# Patient Record
Sex: Male | Born: 1965 | Hispanic: Yes | Marital: Single | Smoking: Never smoker
Health system: Southern US, Community
[De-identification: ages and names within clinical notes are randomized; demographics above are authoritative.]

## PROBLEM LIST (undated history)

## (undated) DIAGNOSIS — I10 Essential (primary) hypertension: Secondary | ICD-10-CM

## (undated) DIAGNOSIS — E7800 Pure hypercholesterolemia, unspecified: Secondary | ICD-10-CM

## (undated) DIAGNOSIS — I2510 Atherosclerotic heart disease of native coronary artery without angina pectoris: Secondary | ICD-10-CM

## (undated) DIAGNOSIS — E785 Hyperlipidemia, unspecified: Secondary | ICD-10-CM

## (undated) DIAGNOSIS — Z9289 Personal history of other medical treatment: Secondary | ICD-10-CM

## (undated) HISTORY — DX: Essential (primary) hypertension: I10

## (undated) HISTORY — DX: Hyperlipidemia, unspecified: E78.5

## (undated) HISTORY — DX: Personal history of other medical treatment: Z92.89

---

## 2021-04-22 ENCOUNTER — Ambulatory Visit
Admission: EM | Admit: 2021-04-22 | Discharge: 2021-04-22 | Payer: 59 | Attending: Emergency Medicine | Admitting: Emergency Medicine

## 2021-04-22 ENCOUNTER — Emergency Department
Admission: EM | Admit: 2021-04-22 | Discharge: 2021-04-22 | Disposition: A | Discharge status: Home / Self Care | Payer: 59 | Attending: Emergency Medicine | Admitting: Emergency Medicine

## 2021-04-22 ENCOUNTER — Emergency Department: Payer: 59

## 2021-04-22 ENCOUNTER — Other Ambulatory Visit: Payer: Self-pay

## 2021-04-22 ENCOUNTER — Encounter: Payer: Self-pay | Admitting: Emergency Medicine

## 2021-04-22 DIAGNOSIS — R079 Chest pain, unspecified: Secondary | ICD-10-CM | POA: Diagnosis not present

## 2021-04-22 DIAGNOSIS — F419 Anxiety disorder, unspecified: Secondary | ICD-10-CM | POA: Diagnosis not present

## 2021-04-22 DIAGNOSIS — R0789 Other chest pain: Secondary | ICD-10-CM | POA: Insufficient documentation

## 2021-04-22 HISTORY — DX: Essential (primary) hypertension: I10

## 2021-04-22 HISTORY — DX: Pure hypercholesterolemia, unspecified: E78.00

## 2021-04-22 LAB — BASIC METABOLIC PANEL
Anion gap: 10 (ref 5–15)
BUN: 16 mg/dL (ref 6–20)
CO2: 26 mmol/L (ref 22–32)
Calcium: 9.5 mg/dL (ref 8.9–10.3)
Chloride: 99 mmol/L (ref 98–111)
Creatinine, Ser: 1.01 mg/dL (ref 0.61–1.24)
GFR, Estimated: >60 mL/min (ref >60)
Glucose, Bld: 117 mg/dL — ABNORMAL HIGH (ref 70–99)
Potassium: 4.1 mmol/L (ref 3.5–5.1)
Sodium: 135 mmol/L (ref 135–145)

## 2021-04-22 LAB — TROPONIN I (HIGH SENSITIVITY)
Troponin I (High Sensitivity): 3 ng/L (ref <18)
Troponin I (High Sensitivity): 3 ng/L (ref <18)

## 2021-04-22 LAB — CBC
HCT: 44.3 % (ref 39.0–52.0)
Hemoglobin: 14.7 g/dL (ref 13.0–17.0)
MCH: 30.1 pg (ref 26.0–34.0)
MCHC: 33.2 g/dL (ref 30.0–36.0)
MCV: 90.6 fL (ref 80.0–100.0)
Platelets: 360 10*3/uL (ref 150–400)
RBC: 4.89 MIL/uL (ref 4.22–5.81)
RDW: 13.7 % (ref 11.5–15.5)
WBC: 10 10*3/uL (ref 4.0–10.5)
nRBC: 0 % (ref 0.0–0.2)

## 2021-04-22 NOTE — ED Triage Notes (Signed)
Pt here with cp that has gotten worse lately. Pt had a scan done that showed that he had an issue with his arteries. Pt states that his chest has been burning. Pt has been unable to establish a cardiologist due to insurance issues. Pt states pain is left sided and radiates to his arm. Pt in NAD in triage.

## 2021-04-22 NOTE — ED Provider Notes (Signed)
[facility] Provider Note    Event Date/Time   First MD Initiated Contact with Patient 04/22/21 1347     (approximate)   History   Chest Pain   HPI  Nathan Hammond is a 56 y.o. male   presents to the ED with complaint of left-sided chest pain for 1 month.  He was seen at [facility] urgent care this morning and was told to come to the emergency department.  Patient denies any diaphoresis, indigestion, shortness of breath, difficulty breathing, reflux symptoms, dizziness or headache.  Patient states he is very anxious as he got a phone call from [employer] letting him know that his arteries were clogging up.  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  This was done on 04/07/2021 a states that recently he got a phone call stating that he he should be seen in the emergency department immediately.  Patient did not go on that day but became very anxious about the information that he was given after talking to his family members.  Patient has history of hypertension but lost approximately 80 pounds and discontinued the blood pressure medication per his PCP in [place] at the time.  Information that he has from the [employer] company states that his overall calcium score is 775.  Patient has lab work that shows a total cholesterol 282, LDL cholesterol 202, cholesterol/HDL ratio 5.5 and a glucose nonfasting of 100.  Patient does state that he is very nervous since receiving this information.  When asked if he has been restricted from his regular activities due to his chest pain he states that he worked a full shift last weekend without any difficulty and that he had no difficulty walking from the parking lot.  He denies any shortness of breath with exertion and this does not increase the pain in his chest.  He describes it as a nonradiating type pain.  Currently rates his pain as 7 out of  10.    Physical Exam   Triage Vital Signs: ED Triage Vitals  Enc Vitals Group     BP 04/22/21 1112 (!) 120/102     Pulse Rate 04/22/21 1112 70     Resp 04/22/21 1112 18     Temp 04/22/21 1112 98.6 F (37 C)     Temp Source 04/22/21 1112 Oral     SpO2 04/22/21 1112 100 %     Weight 04/22/21 1113 190 lb (86.2 kg)     Height 04/22/21 1113 5\' 8"  (1.727 m)     Head Circumference --      Peak Flow --      Pain Score 04/22/21 1112 7     Pain Loc --      Pain Edu? --      Excl. in GC? --     Most recent vital signs: Vitals:   04/22/21 1112 04/22/21 1312  BP: (!) 120/102 115/73  Pulse: 70 61  Resp: 18 16  Temp: 98.6 F (37 C) 98.3 F (36.8 C)  SpO2: 100% 100%     General: Awake, no distress.  Anxious and talkative. CV:  Good peripheral perfusion.  Regular rate and rhythm without murmur. Resp:  Normal effort.  Lungs are clear bilaterally. Abd:  No distention.  Soft nontender bowel sounds normoactive. Other:     ED Results / Procedures / Treatments   Labs (  all labs ordered are listed, but only abnormal results are displayed) Labs Reviewed  BASIC METABOLIC PANEL - Abnormal; Notable for the following components:      Result Value   Glucose, Bld 117 (*)    All other components within normal limits  CBC  TROPONIN I (HIGH SENSITIVITY)  TROPONIN I (HIGH SENSITIVITY)     EKG Normal sinus rhythm. Vent. rate 68 BPM PR interval 176 ms QRS duration 86 ms QT/QTcB 400/425 ms P-R-T axes 41 84 1   RADIOLOGY Chest x-ray images were reviewed and no acute findings noted by this provider.  Radiology report was read and no cardiopulmonary disease noted.    PROCEDURES:  Critical Care performed:   Procedures   MEDICATIONS ORDERED IN ED: Medications - No data to display   IMPRESSION / MDM / ASSESSMENT AND PLAN / ED COURSE  I reviewed the triage vital signs and the nursing notes.   Differential diagnosis includes, but is not limited to, atypical chest pain,  chest wall pain, anxiety, muscle skeletal pain.  Extensive HPI as noted above.  56 year old male presents to the ED with 1 month history of left-sided chest wall pain.  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  Patient had a history of hypertension but after losing approximately 80 pounds he was told by his PCP in [place] that he no longer needed lisinopril he been taking for hypertension.  EKG and chest x-ray were reviewed.  CBC, BMP and 2 separate troponins with a level of 3 each time was reassuring.  Patient was told that he still would need to follow-up with cardiologist.  He was given contact information for Dr. Okey Dupre who is on-call for cardiology day was given to him with address and phone number.  Patient was reassured that if there was heart damage that his troponin should be elevated which helped with his anxiety.  He was also told to return to the emergency department should his chest wall pain change, become severe, with any shortness of breath or difficulty breathing, indigestion or diaphoresis.  Lab work, EKG and troponin were discussed with Dr. Delton Prairie who was my attending today.   FINAL CLINICAL IMPRESSION(S) / ED DIAGNOSES   Final diagnoses:  Anterior chest wall pain  Anxiety     Rx / DC Orders   ED Discharge Orders     None        Note:  This document was prepared using Dragon voice recognition software and may include unintentional dictation errors.   Tommi Rumps, PA-C 04/22/21 1557    Delton Prairie, MD 04/25/21 [phone]

## 2021-04-22 NOTE — Discharge Instructions (Addendum)
As we discussed your active chest pain coupled with your skin are concerning for a coronary event even though your EKG is unremarkable.  Please go to the ER at [facility] via EMS.  Please go now.

## 2021-04-22 NOTE — ED Notes (Signed)
First Nurse Note:  Pt to ED via [facility] from urgent care for chest pain. Pt has been having chest pain x 1 month. Pt had a scan done at the RV show and was told he had blockage in his heart and that he needed to come to the ED. Pt has been having near syncopal episodes since getting that phone call. Pt is in NAD.

## 2021-04-22 NOTE — ED Provider Notes (Addendum)
[facility]    CSN: [id number] Arrival date & time: 04/22/21  4098      History   Chief Complaint Chief Complaint  Patient presents with   Chest Pain    HPI Nathan Hammond is a 56 y.o. male.   HPI  56 year old male here for evaluation of chest pain.  Patient reports that he has been having chest pain that waxes and wanes but is always present since the beginning of the year.  On 04/07/2021 he had a CT at [facility] which showed concerning overall calcium scoring of 775 with a score of 666 to his left anterior descending.  He reports that representative from [employer] [facility] called him last week and suggested he go to the emergency department for evaluation because he was having pain.  He did not go.  He states he is currently having pain in his left chest that radiates down his left arm and is causing some numbness in his left arm.  He describes the pain as a burning and rates it 5-7/10.  He did have an episode of syncope last week for an unknown duration but has not had any further episodes of syncope.  He is not currently reporting radiation to his jaw, nausea, shortness of breath, or sweating.  Patient denies smoking.  We have no past medical history on file and patient denies any significant past medical history.  He is obese.  Upon further questioning patient admits that he was on lisinopril in the past for high blood pressure but he has not taken it recently as his blood pressure improved with weight loss.  Past Medical History:  Diagnosis Date   High blood pressure    High cholesterol     There are no problems to display for this patient.   History reviewed. No pertinent surgical history.      Home Medications    Prior to Admission medications   Medication Sig Start Date End Date Taking? Authorizing Provider  LISINOPRIL PO Take by mouth.   Yes [provider]    Family History History reviewed. No pertinent family history.  Social  History Social History   Tobacco Use   Smoking status: Never   Smokeless tobacco: Never  Vaping Use   Vaping Use: Never used  Substance Use Topics   Alcohol use: Yes   Drug use: Never     Allergies   Patient has no known allergies.   Review of Systems Review of Systems  Respiratory:  Negative for shortness of breath and wheezing.   Cardiovascular:  Positive for chest pain. Negative for palpitations and leg swelling.  Gastrointestinal:  Negative for nausea.  Neurological:  Positive for numbness. Negative for dizziness.    Physical Exam Triage Vital Signs ED Triage Vitals  Enc Vitals Group     BP      Pulse      Resp      Temp      Temp src      SpO2      Weight      Height      Head Circumference      Peak Flow      Pain Score      Pain Loc      Pain Edu?      Excl. in GC?    No data found.  Updated Vital Signs BP (!) 152/68 (BP Location: Left Arm)    Pulse 89    Temp  98.3 F (36.8 C) (Oral)    Resp 18    SpO2 100%   Visual Acuity Right Eye Distance:   Left Eye Distance:   Bilateral Distance:    Right Eye Near:   Left Eye Near:    Bilateral Near:     Physical Exam Vitals and nursing note reviewed.  Constitutional:      General: He is in acute distress.     Appearance: He is obese.  HENT:     Head: Normocephalic and atraumatic.  Neck:     Vascular: No carotid bruit.  Cardiovascular:     Rate and Rhythm: Normal rate and regular rhythm.     Pulses: Normal pulses.     Heart sounds: Normal heart sounds. No murmur heard.   No friction rub. No gallop.  Pulmonary:     Effort: Pulmonary effort is normal.     Breath sounds: Normal breath sounds. No wheezing, rhonchi or rales.  Musculoskeletal:     Cervical back: Normal range of motion and neck supple.  Skin:    General: Skin is warm and dry.     Capillary Refill: Capillary refill takes less than 2 seconds.     Findings: No erythema or rash.  Neurological:     General: No focal deficit present.      Mental Status: He is alert and oriented to person, place, and time.  Psychiatric:        Mood and Affect: Mood normal.        Behavior: Behavior normal.        Thought Content: Thought content normal.        Judgment: Judgment normal.     UC Treatments / Results  Labs (all labs ordered are listed, but only abnormal results are displayed) Labs Reviewed - No data to display  EKG Normal sinus rhythm with a ventricular rate of 85 bpm PR interval 160 ms QRS duration 90 ms QT/QTc 388/461 ms No T wave or ST abnormalities noted No other tracings available for comparison in epic.   Radiology No results found.  Procedures Procedures (including critical care time)  Medications Ordered in UC Medications - No data to display  Initial Impression / Assessment and Plan / UC Course  I have reviewed the triage vital signs and the nursing notes.  Pertinent labs & imaging results that were available during my care of the patient were reviewed by me and considered in my medical decision making (see chart for details).  As described in HPI above this is a 56 year old male here for evaluation of left-sided chest pain that has been ongoing since the first of the year and has intensified over the last several days.  This was associated with 1 episode of syncope last week but no further episodes.  No sweating, nausea, or radiation to the jaw.  No dizziness.  Patient is currently having left-sided chest pain with radiation to the left arm that he rates 5-7/10.  EKG obtained shows normal sinus rhythm with no ST or T wave abnormalities noted.  Patient did have a CT performed at [facility] on 04/07/2021 which showed significant calcium scoring to the left anterior descending.  He was advised by that company to go to the emergency department but he did not.  He is here today because he is having significant pain with radiation to his left arm.  He did start taking aspirin after having his scan and he  took 325 mg of aspirin last  night.  Patient's physical exam reveals a patient who is in a moderate degree of distress with left-sided chest pain.  His heart sounds are S1-S2 and regular rate and rhythm.  No murmur, rub, or ectopy.  Lung sounds are clear to auscultation all fields.  No bruits appreciated when auscultating carotid arteries bilaterally.  Given his active chest pain and a calcium scoring of his recent scan I am concerned that patient is having a coronary event and I have recommended that he go to the emergency department for evaluation.  911 has been called and is in route.  Report given to [facility].  Care transferred.   Final Clinical Impressions(s) / UC Diagnoses   Final diagnoses:  Chest pain, unspecified type     Discharge Instructions      As we discussed your active chest pain coupled with your skin are concerning for a coronary event even though your EKG is unremarkable.  Please go to the ER at [facility] via EMS.  Please go now.     ED Prescriptions   None    PDMP not reviewed this encounter.   Becky Augusta, NP 04/22/21 1000    Becky Augusta, NP 04/22/21 [phone]

## 2021-04-22 NOTE — Discharge Instructions (Signed)
Call make an appointment with Dr. Okey Dupre who is the cardiologist on-call today.  When you call to make the appointment and let them know that you were seen in the emergency department so that they may look over your lab work.  Also keep your appointment with the primary care provider at [facility].  Continue with your regular routine.  If you develop any worsening of your chest pain, shortness of breath, difficulty breathing, indigestion, sweating profusely return to the emergency department immediately.

## 2021-04-22 NOTE — ED Notes (Signed)
Provider at bedside

## 2021-04-22 NOTE — ED Notes (Addendum)
See first nurse and triage note. Pt has been having burning chest pain on L chest since 1 month. Had "chest scan" 04/07/21 in [place] which revealed significant coronary blockages.  Has been having intermittent tingling/pins and needles feeling in L hand since 1 month.  Denies SOB. Is very worried about burning chest pain that has not resolved and results of scan which showed blockages in coronary arteries.   Also concerned because has been having near syncopal episodes with feeling dizzy. Denies exertional dyspnea.  Provider at bedside.

## 2021-04-22 NOTE — ED Triage Notes (Signed)
Patient presents to Urgent Care with complaints of chest pain. He states chest pain started since the beginning of this year. He had scans completed in [place]. Reports from the reports they instructed pt to go to the ED since last week. He also reports some numbness/tingling sensation to left arm.   Denies SOB.

## 2021-04-22 NOTE — ED Notes (Signed)
Patient is being discharged from the Urgent Care and sent to the Emergency Department via EMS . Per Alycia Rossetti, NP, patient is in need of higher level of care due to chest pain. Patient is aware and verbalizes understanding of plan of care.  Vitals:   04/22/21 0928  BP: (!) 152/68  Pulse: 89  Resp: 18  Temp: 98.3 F (36.8 C)  SpO2: 100%

## 2021-04-22 NOTE — ED Notes (Signed)
EMS called for transport.

## 2021-04-29 ENCOUNTER — Other Ambulatory Visit: Payer: Self-pay

## 2021-04-29 ENCOUNTER — Encounter: Payer: Self-pay | Admitting: Cardiology

## 2021-04-29 ENCOUNTER — Ambulatory Visit: Payer: 59 | Admitting: Cardiology

## 2021-04-29 VITALS — BP 120/66 | HR 73 | Ht 68.0 in | Wt 203.0 lb

## 2021-04-29 DIAGNOSIS — I2510 Atherosclerotic heart disease of native coronary artery without angina pectoris: Secondary | ICD-10-CM

## 2021-04-29 DIAGNOSIS — R072 Precordial pain: Secondary | ICD-10-CM

## 2021-04-29 DIAGNOSIS — I10 Essential (primary) hypertension: Secondary | ICD-10-CM | POA: Diagnosis not present

## 2021-04-29 DIAGNOSIS — E7800 Pure hypercholesterolemia, unspecified: Secondary | ICD-10-CM

## 2021-04-29 MED ORDER — ASPIRIN EC 81 MG PO TBEC: 81.0000 mg | DELAYED_RELEASE_TABLET | Freq: Every day | ORAL | 3 refills | Status: DC

## 2021-04-29 MED ORDER — ROSUVASTATIN CALCIUM 40 MG PO TABS: 40.0000 mg | ORAL_TABLET | Freq: Every day | ORAL | 3 refills | Status: DC

## 2021-04-29 MED ORDER — ISOSORBIDE MONONITRATE ER 30 MG PO TB24: 15.0000 mg | ORAL_TABLET | Freq: Every day | ORAL | 3 refills | Status: DC

## 2021-04-29 NOTE — Patient Instructions (Addendum)
Medication Instructions:   Your physician has recommended you make the following change in your medication:   START taking Rosuvastatin (Crestor) 40 MG once a day.  2.   START taking Isosorbide (IMDUR) 15 MG once a day (this will be half a tablet).  3.   START taking Aspirin 81 MG once daily  *If you need a refill on your cardiac medications before your next appointment, please call your pharmacy*   Lab Work: . Your physician recommends that you return for a FASTING lipid profile: At your earliest convenience.  - You will need to be fasting. Please do not have anything to eat or drink after midnight the morning you have the lab work. You may only have water or black coffee with no cream or sugar.   - Please go to the [facility]. You will check in at the front desk to the right as you walk into the atrium. Valet Parking is offered if needed.   - No appointment needed. You may go any day between 8 am and 6 pm.     Testing/Procedures:   Your physician has requested that you have an echocardiogram. Echocardiography is a painless test that uses sound waves to create images of your heart. It provides your doctor with information about the size and shape of your heart and how well your hearts chambers and valves are working. This procedure takes approximately one hour. There are no restrictions for this procedure.   2.   You are scheduled for a Cardiac Catheterization on Wednesday, March 8 with Dr. Lorine Bears.  1. Please arrive at the [facility] (Main Entrance A) at [facility]: [address] [place], [place] [address] at 12:00 PM (This time is two hours before your procedure to ensure your preparation). Free valet parking service is available.   Special note: Every effort is made to have your procedure done on time. Please understand that emergencies sometimes delay scheduled procedures.  2. Diet: Do not eat solid foods after midnight.  The patient may have clear  liquids until 5am upon the day of the procedure.  3. Labs: Already drawn on 04/22/21  4. Medication instructions in preparation for your procedure:   Contrast Allergy: No   On the morning of your procedure, take your Aspirin and any morning medicines NOT listed above.  You may use sips of water.  5. Plan for one night stay--bring personal belongings. 6. Bring a current list of your medications and current insurance cards. 7. You MUST have a responsible person to drive you home. 8. Someone MUST be with you the first 24 hours after you arrive home or your discharge will be delayed. 9. Please wear clothes that are easy to get on and off and wear slip-on shoes.  Thank you for allowing [place] to care for you!   -- [facility]   Follow-Up: At [facility], you and your health needs are our priority.  As part of our continuing mission to provide you with exceptional heart care, we have created designated Provider Care Teams.  These Care Teams include your primary Cardiologist (physician) and Advanced Practice Providers (APPs -  Physician Assistants and Nurse Practitioners) who all work together to provide you with the care you need, when you need it.  We recommend signing up for the patient portal called "MyChart".  Sign up information is provided on this After Visit Summary.  MyChart is used to connect with patients for Virtual  Visits (Telemedicine).  Patients are able to view lab/test results, encounter notes, upcoming appointments, etc.  Non-urgent messages can be sent to your provider as well.   To learn more about what you can do with MyChart, go to [email or web address].    Your next appointment:   Follow up in 4-6 weeks   The format for your next appointment:   In Person  Provider:    ONLY WITH Debbe Odea, MD    Other Instructions

## 2021-04-29 NOTE — Progress Notes (Addendum)
Cardiology Office Note:    Date:  04/29/2021   ID:  Nathan Hammond, DOB 1965/04/29, MRN [id number]  PCP:  Pcp, No   [facility] Providers Cardiologist:  None     Referring MD: Delton Prairie, MD   Chief Complaint  Patient presents with   New Patient (Initial Visit)    Referred for Chest pain. Meds reviewed verbally with patient.    Nathan Hammond is a 56 y.o. male who is being seen today for the evaluation of chest pain at the request of Delton Prairie, MD.   History of Present Illness:    Nathan Hammond is a 56 y.o. male with a hx of CAD, presenting with symptoms of chest pain.    Patient had an outside coronary calcium score obtained 04/07/2021 showed total calcium score 775.(LAD 666, LCx 109).   Lipid panel also reported obtain 12/26/2019 showed total cholesterol 282, LDL 202  He states being anxious and nervous things calcium score was obtained.  Presented to the ED 04/22/2021 due to symptoms of chest pain.  EKG with no acute ischemia, troponins were normal.  Complains of on and on chest pain, worried about his heart.  His father passed from a heart attack in his 53s.  Mother also has coronary artery calcifications.  He denies smoking, takes lisinopril for blood pressure as needed.  Has not taken BP meds the past 2 to 3 weeks.  Does not take any medication for cholesterol.  Last cholesterol check was 2021 due to lack of insurance.  Past Medical History:  Diagnosis Date   High blood pressure    High cholesterol     History reviewed. No pertinent surgical history.  Current Medications: Current Meds  Medication Sig   aspirin EC 81 MG tablet Take 1 tablet (81 mg total) by mouth daily. Swallow whole.   isosorbide mononitrate (IMDUR) 30 MG 24 hr tablet Take 0.5 tablets (15 mg total) by mouth daily.   lisinopril (ZESTRIL) 20 MG tablet Take 20 mg by mouth as needed.   rosuvastatin (CRESTOR) 40 MG tablet Take 1 tablet (40 mg total) by mouth daily.     Allergies:   Patient has  no known allergies.   Social History   Socioeconomic History   Marital status: Unknown    Spouse name: Not on file   Number of children: Not on file   Years of education: Not on file   Highest education level: Not on file  Occupational History   Not on file  Tobacco Use   Smoking status: Never   Smokeless tobacco: Never  Vaping Use   Vaping Use: Never used  Substance and Sexual Activity   Alcohol use: Yes   Drug use: Never   Sexual activity: Not on file  Other Topics Concern   Not on file  Social History Narrative   Not on file   Social Determinants of Health   Financial Resource Strain: Not on file  Food Insecurity: Not on file  Transportation Needs: Not on file  Physical Activity: Not on file  Stress: Not on file  Social Connections: Not on file     Family History: The patient's family history is not on file.  ROS:   Please see the history of present illness.     All other systems reviewed and are negative.  EKGs/Labs/Other Studies Reviewed:    The following studies were reviewed today:   EKG:  EKG not  ordered today.    Recent Labs: 04/22/2021: BUN 16;  Creatinine, Ser 1.01; Hemoglobin 14.7; Platelets 360; Potassium 4.1; Sodium 135  Recent Lipid Panel No results found for: CHOL, TRIG, HDL, CHOLHDL, VLDL, LDLCALC, LDLDIRECT   Risk Assessment/Calculations:          Physical Exam:    VS:  BP 120/66 (BP Location: Left Arm, Patient Position: Sitting, Cuff Size: Normal)    Pulse 73    Ht 5\' 8"  (1.727 m)    Wt 203 lb (92.1 kg)    SpO2 98%    BMI 30.87 kg/m     Wt Readings from Last 3 Encounters:  04/29/21 203 lb (92.1 kg)  04/22/21 190 lb (86.2 kg)     GEN:  Well nourished, well developed in no acute distress HEENT: Normal NECK: No JVD; No carotid bruits LYMPHATICS: No lymphadenopathy CARDIAC: RRR, no murmurs, rubs, gallops RESPIRATORY:  Clear to auscultation without rales, wheezing or rhonchi  ABDOMEN: Soft, non-tender,  non-distended MUSCULOSKELETAL:  No edema; No deformity  SKIN: Warm and dry NEUROLOGIC:  Alert and oriented x 3 PSYCHIATRIC:  Normal affect   ASSESSMENT:    1. Coronary artery disease involving native coronary artery of native heart, unspecified whether angina present   2. Precordial pain   3. Primary hypertension   4. Pure hypercholesterolemia    PLAN:    In order of problems listed above:  CAD, calcium score 775.  Patient is high risk.  Also endorsed chest pain.  Obtain echocardiogram, schedule left heart cath.  Patient has high pretest probability, as such, a normal stress test is not reassuring.  Start aspirin 81 mg, Crestor 40 mg daily.  Obtain fasting lipid profile.  Start Imdur 15 mg daily for antianginal benefit Chest pain, work-up as above Hypertension, Imdur 15 mg daily. Hyperlipidemia, start Crestor, obtain fasting lipid profile  Follow-up after echo and left heart cath      Shared Decision Making/Informed Consent The risks [stroke (1 in 1000), death (1 in 1000), kidney failure [usually temporary] (1 in 500), bleeding (1 in 200), allergic reaction [possibly serious] (1 in 200)], benefits (diagnostic support and management of coronary artery disease) and alternatives of a cardiac catheterization were discussed in detail with Mr. Soisson and he is willing to proceed.    Medication Adjustments/Labs and Tests Ordered: Current medicines are reviewed at length with the patient today.  Concerns regarding medicines are outlined above.  Orders Placed This Encounter  Procedures   Lipid panel   ECHOCARDIOGRAM COMPLETE   Meds ordered this encounter  Medications   isosorbide mononitrate (IMDUR) 30 MG 24 hr tablet    Sig: Take 0.5 tablets (15 mg total) by mouth daily.    Dispense:  15 tablet    Refill:  3   aspirin EC 81 MG tablet    Sig: Take 1 tablet (81 mg total) by mouth daily. Swallow whole.    Dispense:  90 tablet    Refill:  3   rosuvastatin (CRESTOR) 40 MG tablet     Sig: Take 1 tablet (40 mg total) by mouth daily.    Dispense:  30 tablet    Refill:  3    Patient Instructions  Medication Instructions:   Your physician has recommended you make the following change in your medication:   START taking Rosuvastatin (Crestor) 40 MG once a day.  2.   START taking Isosorbide (IMDUR) 15 MG once a day (this will be half a tablet).  3.   START taking Aspirin 81 MG once daily  *If you need  a refill on your cardiac medications before your next appointment, please call your pharmacy*   Lab Work: . Your physician recommends that you return for a FASTING lipid profile: At your earliest convenience.  - You will need to be fasting. Please do not have anything to eat or drink after midnight the morning you have the lab work. You may only have water or black coffee with no cream or sugar.   - Please go to the [facility]. You will check in at the front desk to the right as you walk into the atrium. Valet Parking is offered if needed.   - No appointment needed. You may go any day between 8 am and 6 pm.     Testing/Procedures:   Your physician has requested that you have an echocardiogram. Echocardiography is a painless test that uses sound waves to create images of your heart. It provides your doctor with information about the size and shape of your heart and how well your hearts chambers and valves are working. This procedure takes approximately one hour. There are no restrictions for this procedure.   2.   You are scheduled for a Cardiac Catheterization on Wednesday, March 8 with Dr. Lorine BearsMuhammad Arida.  1. Please arrive [facility] ([facility]ance A) at [facility]: [address] [place], [place] [address] at 12:00 PM (This time is two hours before your procedure to ensure your preparation). Free valet parking service is available.   Special note: Every effort is made to have your procedure done on time. Please understand that  emergencies sometimes delay scheduled procedures.  2. Diet: Do not eat solid foods after midnight.  The patient may have clear liquids until 5am upon the day of the procedure.  3. Labs: Already drawn on 04/22/21  4. Medication instructions in preparation for your procedure:   Contrast Allergy: No   On the morning of your procedure, take your Aspirin and any morning medicines NOT listed above.  You may use sips of water.  5. Plan for one night stay--bring personal belongings. 6. Bring a current list of your medications and current insurance cards. 7. You MUST have a responsible person to drive you home. 8. Someone MUST be with you the first 24 hours after you arrive home or your discharge will be delayed. 9. Please wear clothes that are easy to get on and off and wear slip-on shoes.  Thank you for allowing us to care for you!   -- [facility]   Follow-Up: At [facility], you and your health needs are our priority.  As part of our continuing mission to provide you with exceptional heart care, we have created designated Provider Care Teams.  These Care Teams include your primary Cardiologist (physician) and Advanced Practice Providers (APPs -  Physician Assistants and Nurse Practitioners) who all work together to provide you with the care you need, when you need it.  We recommend signing up for the patient portal called "MyChart".  Sign up information is provided on this After Visit Summary.  MyChart is used to connect with patients for Virtual Visits (Telemedicine).  Patients are able to view lab/test results, encounter notes, upcoming appointments, etc.  Non-urgent messages can be sent to your provider as well.   To learn more about what you can do with MyChart, go to [email or web address].    Your next appointment:   Follow up in 4-6 weeks   The format for your next  appointment:   In Person  Provider:    ONLY WITH Debbe Odea, MD     Other Instructions     Signed, Debbe Odea, MD  04/29/2021 10:51 AM    [facility]

## 2021-04-29 NOTE — H&P (View-Only) (Signed)
Cardiology Office Note:    Date:  04/29/2021   ID:  Nathan Hammond, DOB 1965/04/29, MRN [id number]  PCP:  Pcp, No   [facility] Providers Cardiologist:  None     Referring MD: Delton Prairie, MD   Chief Complaint  Patient presents with   New Patient (Initial Visit)    Referred for Chest pain. Meds reviewed verbally with patient.    Nathan Hammond is a 56 y.o. male who is being seen today for the evaluation of chest pain at the request of Delton Prairie, MD.   History of Present Illness:    Nathan Hammond is a 56 y.o. male with a hx of CAD, presenting with symptoms of chest pain.    Patient had an outside coronary calcium score obtained 04/07/2021 showed total calcium score 775.(LAD 666, LCx 109).   Lipid panel also reported obtain 12/26/2019 showed total cholesterol 282, LDL 202  He states being anxious and nervous things calcium score was obtained.  Presented to the ED 04/22/2021 due to symptoms of chest pain.  EKG with no acute ischemia, troponins were normal.  Complains of on and on chest pain, worried about his heart.  His father passed from a heart attack in his 53s.  Mother also has coronary artery calcifications.  He denies smoking, takes lisinopril for blood pressure as needed.  Has not taken BP meds the past 2 to 3 weeks.  Does not take any medication for cholesterol.  Last cholesterol check was 2021 due to lack of insurance.  Past Medical History:  Diagnosis Date   High blood pressure    High cholesterol     History reviewed. No pertinent surgical history.  Current Medications: Current Meds  Medication Sig   aspirin EC 81 MG tablet Take 1 tablet (81 mg total) by mouth daily. Swallow whole.   isosorbide mononitrate (IMDUR) 30 MG 24 hr tablet Take 0.5 tablets (15 mg total) by mouth daily.   lisinopril (ZESTRIL) 20 MG tablet Take 20 mg by mouth as needed.   rosuvastatin (CRESTOR) 40 MG tablet Take 1 tablet (40 mg total) by mouth daily.     Allergies:   Patient has  no known allergies.   Social History   Socioeconomic History   Marital status: Unknown    Spouse name: Not on file   Number of children: Not on file   Years of education: Not on file   Highest education level: Not on file  Occupational History   Not on file  Tobacco Use   Smoking status: Never   Smokeless tobacco: Never  Vaping Use   Vaping Use: Never used  Substance and Sexual Activity   Alcohol use: Yes   Drug use: Never   Sexual activity: Not on file  Other Topics Concern   Not on file  Social History Narrative   Not on file   Social Determinants of Health   Financial Resource Strain: Not on file  Food Insecurity: Not on file  Transportation Needs: Not on file  Physical Activity: Not on file  Stress: Not on file  Social Connections: Not on file     Family History: The patient's family history is not on file.  ROS:   Please see the history of present illness.     All other systems reviewed and are negative.  EKGs/Labs/Other Studies Reviewed:    The following studies were reviewed today:   EKG:  EKG not  ordered today.    Recent Labs: 04/22/2021: BUN 16;  Creatinine, Ser 1.01; Hemoglobin 14.7; Platelets 360; Potassium 4.1; Sodium 135  Recent Lipid Panel No results found for: CHOL, TRIG, HDL, CHOLHDL, VLDL, LDLCALC, LDLDIRECT   Risk Assessment/Calculations:          Physical Exam:    VS:  BP 120/66 (BP Location: Left Arm, Patient Position: Sitting, Cuff Size: Normal)    Pulse 73    Ht 5\' 8"  (1.727 m)    Wt 203 lb (92.1 kg)    SpO2 98%    BMI 30.87 kg/m     Wt Readings from Last 3 Encounters:  04/29/21 203 lb (92.1 kg)  04/22/21 190 lb (86.2 kg)     GEN:  Well nourished, well developed in no acute distress HEENT: Normal NECK: No JVD; No carotid bruits LYMPHATICS: No lymphadenopathy CARDIAC: RRR, no murmurs, rubs, gallops RESPIRATORY:  Clear to auscultation without rales, wheezing or rhonchi  ABDOMEN: Soft, non-tender,  non-distended MUSCULOSKELETAL:  No edema; No deformity  SKIN: Warm and dry NEUROLOGIC:  Alert and oriented x 3 PSYCHIATRIC:  Normal affect   ASSESSMENT:    1. Coronary artery disease involving native coronary artery of native heart, unspecified whether angina present   2. Precordial pain   3. Primary hypertension   4. Pure hypercholesterolemia    PLAN:    In order of problems listed above:  CAD, calcium score 775.  Patient is high risk.  Also endorsed chest pain.  Obtain echocardiogram, schedule left heart cath.  Patient has high pretest probability as such abnormal stress test is not reassuring.  Start aspirin 81 mg, Crestor 40 mg daily.  Obtain fasting lipid profile.  Start Imdur 15 mg daily for antianginal benefit Chest pain, work-up as above Hypertension, Imdur 15 mg daily. Hyperlipidemia, start Crestor, obtain fasting lipid profile  Follow-up after echo and left heart cath      Shared Decision Making/Informed Consent The risks [stroke (1 in 1000), death (1 in 1000), kidney failure [usually temporary] (1 in 500), bleeding (1 in 200), allergic reaction [possibly serious] (1 in 200)], benefits (diagnostic support and management of coronary artery disease) and alternatives of a cardiac catheterization were discussed in detail with Nathan Hammond and he is willing to proceed.    Medication Adjustments/Labs and Tests Ordered: Current medicines are reviewed at length with the patient today.  Concerns regarding medicines are outlined above.  Orders Placed This Encounter  Procedures   Lipid panel   ECHOCARDIOGRAM COMPLETE   Meds ordered this encounter  Medications   isosorbide mononitrate (IMDUR) 30 MG 24 hr tablet    Sig: Take 0.5 tablets (15 mg total) by mouth daily.    Dispense:  15 tablet    Refill:  3   aspirin EC 81 MG tablet    Sig: Take 1 tablet (81 mg total) by mouth daily. Swallow whole.    Dispense:  90 tablet    Refill:  3   rosuvastatin (CRESTOR) 40 MG tablet     Sig: Take 1 tablet (40 mg total) by mouth daily.    Dispense:  30 tablet    Refill:  3    Patient Instructions  Medication Instructions:   Your physician has recommended you make the following change in your medication:   START taking Rosuvastatin (Crestor) 40 MG once a day.  2.   START taking Isosorbide (IMDUR) 15 MG once a day (this will be half a tablet).  3.   START taking Aspirin 81 MG once daily  *If you need a  refill on your cardiac medications before your next appointment, please call your pharmacy*   Lab Work: . Your physician recommends that you return for a FASTING lipid profile: At your earliest convenience.  - You will need to be fasting. Please do not have anything to eat or drink after midnight the morning you have the lab work. You may only have water or black coffee with no cream or sugar.   - Please go to the [facility]. You will check in at the front desk to the right as you walk into the atrium. Valet Parking is offered if needed.   - No appointment needed. You may go any day between 8 am and 6 pm.     Testing/Procedures:   Your physician has requested that you have an echocardiogram. Echocardiography is a painless test that uses sound waves to create images of your heart. It provides your doctor with information about the size and shape of your heart and how well your hearts chambers and valves are working. This procedure takes approximately one hour. There are no restrictions for this procedure.   2.   You are scheduled for a Cardiac Catheterization on Wednesday, March 8 with Dr. Lorine Bears.  1. Please arrive at the [facility] (Main Entrance A) at [facility]: [address] [place], [place] [address] at 12:00 PM (This time is two hours before your procedure to ensure your preparation). Free valet parking service is available.   Special note: Every effort is made to have your procedure done on time. Please understand that emergencies  sometimes delay scheduled procedures.  2. Diet: Do not eat solid foods after midnight.  The patient may have clear liquids until 5am upon the day of the procedure.  3. Labs: Already drawn on 04/22/21  4. Medication instructions in preparation for your procedure:   Contrast Allergy: No   On the morning of your procedure, take your Aspirin and any morning medicines NOT listed above.  You may use sips of water.  5. Plan for one night stay--bring personal belongings. 6. Bring a current list of your medications and current insurance cards. 7. You MUST have a responsible person to drive you home. 8. Someone MUST be with you the first 24 hours after you arrive home or your discharge will be delayed. 9. Please wear clothes that are easy to get on and off and wear slip-on shoes.  Thank you for allowing [place] to care for you!   -- [facility]   Follow-Up: At [facility], you and your health needs are our priority.  As part of our continuing mission to provide you with exceptional heart care, we have created designated Provider Care Teams.  These Care Teams include your primary Cardiologist (physician) and Advanced Practice Providers (APPs -  Physician Assistants and Nurse Practitioners) who all work together to provide you with the care you need, when you need it.  We recommend signing up for the patient portal called "MyChart".  Sign up information is provided on this After Visit Summary.  MyChart is used to connect with patients for Virtual Visits (Telemedicine).  Patients are able to view lab/test results, encounter notes, upcoming appointments, etc.  Non-urgent messages can be sent to your provider as well.   To learn more about what you can do with MyChart, go to [email or web address].    Your next appointment:   Follow up in 4-6 weeks   The format for your next appointment:  In Person  Provider:    ONLY WITH Debbe Odea, MD    Other  Instructions     Signed, Debbe Odea, MD  04/29/2021 10:51 AM    [facility]

## 2021-05-06 ENCOUNTER — Telehealth: Payer: Self-pay | Admitting: *Deleted

## 2021-05-06 NOTE — Telephone Encounter (Signed)
Cardiac catheterization scheduled at [facility] for: Wednesday May 07, 2021 2 PM ?[facility] Main Entrance A  at: 39 Noon ? ? ?Diet-no solid food after midnight prior to cath, clear liquids until 5 AM day of procedure. ? ?Medication instructions for procedure: ?-Usual morning medications can be taken pre-cath with sips of water including aspirin 81 mg. ?   ?Must have responsible adult to drive home post procedure and be with patient first 24 hours after arriving home. ? ?[facility] does allow one visitor to wait in the waiting room during the time you are there* ? ? ?Patient reports does not currently have any new symptoms concerning for COVID-19 and no household members with COVID-19 like illness.  ? ? ? ?Reviewed procedure instructions with patient. ? ?*Patient is aware current [employer] for procedures is one visitor may stay in the waiting area during the time he is there for procedure. ?He is aware current [facility] Inpatient Policy is two visitors in the patient's room at one time. ?   ? ? ? ? ?

## 2021-05-07 ENCOUNTER — Ambulatory Visit (HOSPITAL_COMMUNITY)
Admission: RE | Admit: 2021-05-07 | Discharge: 2021-05-07 | Disposition: A | Discharge status: Home / Self Care | Payer: 59 | Attending: Cardiovascular Disease | Admitting: Cardiovascular Disease

## 2021-05-07 ENCOUNTER — Encounter (HOSPITAL_COMMUNITY)
Admission: RE | Disposition: A | Discharge status: Home / Self Care | Payer: Self-pay | Source: Home / Self Care | Attending: Cardiovascular Disease

## 2021-05-07 ENCOUNTER — Other Ambulatory Visit: Payer: Self-pay

## 2021-05-07 DIAGNOSIS — Z7982 Long term (current) use of aspirin: Secondary | ICD-10-CM | POA: Diagnosis not present

## 2021-05-07 DIAGNOSIS — E7800 Pure hypercholesterolemia, unspecified: Secondary | ICD-10-CM | POA: Diagnosis not present

## 2021-05-07 DIAGNOSIS — Z79899 Other long term (current) drug therapy: Secondary | ICD-10-CM | POA: Insufficient documentation

## 2021-05-07 DIAGNOSIS — I25118 Atherosclerotic heart disease of native coronary artery with other forms of angina pectoris: Secondary | ICD-10-CM

## 2021-05-07 DIAGNOSIS — I10 Essential (primary) hypertension: Secondary | ICD-10-CM | POA: Insufficient documentation

## 2021-05-07 DIAGNOSIS — Z8249 Family history of ischemic heart disease and other diseases of the circulatory system: Secondary | ICD-10-CM | POA: Insufficient documentation

## 2021-05-07 HISTORY — PX: LEFT HEART CATH AND CORONARY ANGIOGRAPHY: CATH118249

## 2021-05-07 SURGERY — LEFT HEART CATH AND CORONARY ANGIOGRAPHY
Anesthesia: LOCAL

## 2021-05-07 MED ORDER — VERAPAMIL HCL 2.5 MG/ML IV SOLN
INTRAVENOUS | Status: DC | PRN
  Administered 2021-05-07: 10 mL via INTRA_ARTERIAL

## 2021-05-07 MED ORDER — SODIUM CHLORIDE 0.9 % IV SOLN: INTRAVENOUS | Status: DC

## 2021-05-07 MED ORDER — ASPIRIN 81 MG PO CHEW: 81.0000 mg | CHEWABLE_TABLET | ORAL | Status: DC

## 2021-05-07 MED ORDER — SODIUM CHLORIDE 0.9 % IV SOLN: 250.0000 mL | INTRAVENOUS | Status: DC | PRN

## 2021-05-07 MED ORDER — LIDOCAINE HCL (PF) 1 % IJ SOLN
INTRAMUSCULAR | Status: AC
  Filled 2021-05-07: qty 30

## 2021-05-07 MED ORDER — SODIUM CHLORIDE 0.9% FLUSH: 3.0000 mL | Freq: Two times a day (BID) | INTRAVENOUS | Status: DC

## 2021-05-07 MED ORDER — SODIUM CHLORIDE 0.9 % WEIGHT BASED INFUSION
3.0000 mL/kg/h | INTRAVENOUS | Status: AC
  Administered 2021-05-07: 3 mL/kg/h via INTRAVENOUS

## 2021-05-07 MED ORDER — HEPARIN (PORCINE) IN NACL 1000-0.9 UT/500ML-% IV SOLN
INTRAVENOUS | Status: DC | PRN
  Administered 2021-05-07 (×2): 500 mL

## 2021-05-07 MED ORDER — MIDAZOLAM HCL 2 MG/2ML IJ SOLN
INTRAMUSCULAR | Status: AC
  Filled 2021-05-07: qty 2

## 2021-05-07 MED ORDER — MIDAZOLAM HCL 2 MG/2ML IJ SOLN
INTRAMUSCULAR | Status: DC | PRN
  Administered 2021-05-07 (×2): 1 mg via INTRAVENOUS

## 2021-05-07 MED ORDER — SODIUM CHLORIDE 0.9% FLUSH: 3.0000 mL | INTRAVENOUS | Status: DC | PRN

## 2021-05-07 MED ORDER — VERAPAMIL HCL 2.5 MG/ML IV SOLN
INTRAVENOUS | Status: AC
  Filled 2021-05-07: qty 2

## 2021-05-07 MED ORDER — HEPARIN SODIUM (PORCINE) 1000 UNIT/ML IJ SOLN
INTRAMUSCULAR | Status: DC | PRN
  Administered 2021-05-07: 4000 [IU] via INTRAVENOUS

## 2021-05-07 MED ORDER — HEPARIN (PORCINE) IN NACL 1000-0.9 UT/500ML-% IV SOLN
INTRAVENOUS | Status: AC
Start: 2021-05-07 — End: ?
  Filled 2021-05-07: qty 500

## 2021-05-07 MED ORDER — IOHEXOL 350 MG/ML SOLN
INTRAVENOUS | Status: DC | PRN
  Administered 2021-05-07: 50 mL

## 2021-05-07 MED ORDER — HEPARIN SODIUM (PORCINE) 1000 UNIT/ML IJ SOLN
INTRAMUSCULAR | Status: AC
  Filled 2021-05-07: qty 10

## 2021-05-07 MED ORDER — FENTANYL CITRATE (PF) 100 MCG/2ML IJ SOLN
INTRAMUSCULAR | Status: AC
  Filled 2021-05-07: qty 2

## 2021-05-07 MED ORDER — ONDANSETRON HCL 4 MG/2ML IJ SOLN: 4.0000 mg | Freq: Four times a day (QID) | INTRAMUSCULAR | Status: DC | PRN

## 2021-05-07 MED ORDER — FENTANYL CITRATE (PF) 100 MCG/2ML IJ SOLN
INTRAMUSCULAR | Status: DC | PRN
  Administered 2021-05-07 (×2): 25 ug via INTRAVENOUS

## 2021-05-07 MED ORDER — HEPARIN (PORCINE) IN NACL 1000-0.9 UT/500ML-% IV SOLN
INTRAVENOUS | Status: AC
  Filled 2021-05-07: qty 500

## 2021-05-07 MED ORDER — LIDOCAINE HCL (PF) 1 % IJ SOLN
INTRAMUSCULAR | Status: DC | PRN
  Administered 2021-05-07: 2 mL

## 2021-05-07 MED ORDER — SODIUM CHLORIDE 0.9 % WEIGHT BASED INFUSION: 1.0000 mL/kg/h | INTRAVENOUS | Status: DC

## 2021-05-07 MED ORDER — ACETAMINOPHEN 325 MG PO TABS: 650.0000 mg | ORAL_TABLET | ORAL | Status: DC | PRN

## 2021-05-07 SURGICAL SUPPLY — 9 items
CATH INFINITI 5FR JK (CATHETERS) ×1 IMPLANT
DEVICE RAD COMP TR BAND LRG (VASCULAR PRODUCTS) ×1 IMPLANT
GLIDESHEATH SLEND SS 6F .021 (SHEATH) ×1 IMPLANT
GUIDEWIRE INQWIRE 1.5J.035X260 (WIRE) IMPLANT
INQWIRE 1.5J .035X260CM (WIRE) ×2
KIT HEART LEFT (KITS) ×2 IMPLANT
PACK CARDIAC CATHETERIZATION (CUSTOM PROCEDURE TRAY) ×2 IMPLANT
TRANSDUCER W/STOPCOCK (MISCELLANEOUS) ×2 IMPLANT
TUBING CIL FLEX 10 FLL-RA (TUBING) ×2 IMPLANT

## 2021-05-07 NOTE — Interval H&P Note (Signed)
Cath Lab Visit (complete for each Cath Lab visit) ? ?Clinical Evaluation Leading to the Procedure:  ? ?ACS: No. ? ?Non-ACS:   ? ?Anginal Classification: CCS III ? ?Anti-ischemic medical therapy: Minimal Therapy (1 class of medications) ? ?Non-Invasive Test Results: No non-invasive testing performed ? ?Prior CABG: No previous CABG ? ? ? ? ? ?History and Physical Interval Note: ? ?05/07/2021 ?2:16 PM ? ?Nathan Hammond  has presented today for surgery, with the diagnosis of cad - chest pain.  The various methods of treatment have been discussed with the patient and family. After consideration of risks, benefits and other options for treatment, the patient has consented to  Procedure(s): ?LEFT HEART CATH AND CORONARY ANGIOGRAPHY (N/A) as a surgical intervention.  The patient's history has been reviewed, patient examined, no change in status, stable for surgery.  I have reviewed the patient's chart and labs.  Questions were answered to the patient's satisfaction.   ? ? ?Lorine Bears ? ? ?

## 2021-05-08 ENCOUNTER — Encounter (HOSPITAL_COMMUNITY): Payer: Self-pay | Admitting: Cardiovascular Disease

## 2021-05-14 ENCOUNTER — Telehealth: Payer: Self-pay | Admitting: Cardiology

## 2021-05-14 DIAGNOSIS — Z0279 Encounter for issue of other medical certificate: Secondary | ICD-10-CM

## 2021-05-14 NOTE — Telephone Encounter (Signed)
Patient signed release forms and paid $29 fee cash  ?Placed forms in nurse box ?

## 2021-05-14 NOTE — Telephone Encounter (Signed)
Informed patient we received disability forms from [employer] ?Patient will be by office to complete release forms and pay $29 fee ? ?

## 2021-05-14 NOTE — Telephone Encounter (Signed)
Reviewed patients chart and he is to see [employer] on 05/26/21 to discuss having a CABG. We are faxing the paperwork to their office. I called patient back and informed him we will refund his payment and fax the forms to [employer]. Patient requested to have his cash refunded when he is here for his Echo on 05/21/21.  ?

## 2021-05-16 NOTE — Telephone Encounter (Signed)
Sent via fax to [employer] for review by MD at upcoming visit 3-27. ? ? ?

## 2021-05-19 ENCOUNTER — Ambulatory Visit: Payer: 59 | Admitting: Cardiology

## 2021-05-21 ENCOUNTER — Other Ambulatory Visit: Payer: Self-pay

## 2021-05-21 ENCOUNTER — Ambulatory Visit (INDEPENDENT_AMBULATORY_CARE_PROVIDER_SITE_OTHER): Payer: 59

## 2021-05-21 ENCOUNTER — Other Ambulatory Visit
Admission: RE | Admit: 2021-05-21 | Discharge: 2021-05-21 | Disposition: A | Discharge status: Home / Self Care | Payer: 59 | Attending: Cardiology | Admitting: Cardiology

## 2021-05-21 DIAGNOSIS — I2510 Atherosclerotic heart disease of native coronary artery without angina pectoris: Secondary | ICD-10-CM

## 2021-05-21 LAB — ECHOCARDIOGRAM COMPLETE
AR max vel: 3.07 cm2
AV Area VTI: 3.07 cm2
AV Area mean vel: 3.27 cm2
AV Mean grad: 4 mmHg
AV Peak grad: 7.3 mmHg
Ao pk vel: 1.35 m/s
Area-P 1/2: 4.8 cm2
Calc EF: 63.9 %
S' Lateral: 3.1 cm
Single Plane A2C EF: 68.5 %
Single Plane A4C EF: 61.2 %

## 2021-05-21 LAB — LIPID PANEL
Cholesterol: 154 mg/dL (ref 0–200)
HDL: 66 mg/dL (ref >40)
LDL Cholesterol: 77 mg/dL (ref 0–99)
Total CHOL/HDL Ratio: 2.3 RATIO
Triglycerides: 54 mg/dL (ref <150)
VLDL: 11 mg/dL (ref 0–40)

## 2021-05-26 ENCOUNTER — Encounter: Payer: 59 | Admitting: Cardiothoracic Surgery

## 2021-05-26 ENCOUNTER — Telehealth: Payer: Self-pay

## 2021-05-26 NOTE — Telephone Encounter (Signed)
Forwarding to Dr. Merita Norton (primary cardiologist) nurse to see if she still has the patient's forms.  ?

## 2021-05-26 NOTE — Telephone Encounter (Signed)
Patient calling  ?States that [employer] cannot do the FMLA forms - they have pushed his appt back to 04/03 and the surgery back further ?Patient states they want Dr Kirke Corin to complete forms before 04/02 since he did cath and took him out of work ?Please call to discuss  ?

## 2021-05-26 NOTE — Telephone Encounter (Signed)
Attempted to reschedule new patient appointment for patient with Dr. Donata Clay. Physician had to reschedule due to OR emergency. Called patient to make aware of new patient appointment time of Monday,  06/02/21. Patient was irate that his STD was not filled out already. He states that he needed the forms filled out by 4/2. Patient has not been seen in the office yet for evaluation by a physician and forms will not be completed. If patient has already been taken out of work, patient will need his forms filled out by his Cardiologist office or whomever took him out. Our office will fill forms out for FMLA /STD once patient has had surgery. Patient made aware of situation and aware of his new appointment time and advised to call the office back if he needs to reschedule. Also advised, per patient that we would get him in sooner if a cancellation occurs. Refaxed forms to Dr. Merita Norton office for further review. ?

## 2021-05-27 NOTE — Telephone Encounter (Signed)
FMLA paperwork has been signed and faxed. Called patient to inform him. He will be in office tomorrow morning to pick up his copy and pay the $29 fee. Patient was very grateful for the follow up. ?

## 2021-05-27 NOTE — Telephone Encounter (Signed)
Will speak with Dr. Kirke Corin as Dr. Azucena Cecil is out of the office until next week to inquire about FMLA forms. ?

## 2021-05-27 NOTE — Telephone Encounter (Signed)
Dr. Kirke Corin agreed to sign FMLA paperwork for patient. I called and informed the patient that I am waiting for the signed paperwork and that I will call him today or tomorrow when it is ready.  Patient is aware that he will have to bring the $29 fee in when he picks up his copy. ?Patient was very grateful for the call back. ?

## 2021-05-28 DIAGNOSIS — E7800 Pure hypercholesterolemia, unspecified: Secondary | ICD-10-CM | POA: Insufficient documentation

## 2021-05-28 DIAGNOSIS — E785 Hyperlipidemia, unspecified: Secondary | ICD-10-CM | POA: Insufficient documentation

## 2021-05-28 DIAGNOSIS — I10 Essential (primary) hypertension: Secondary | ICD-10-CM | POA: Insufficient documentation

## 2021-05-28 NOTE — Telephone Encounter (Signed)
Patient paid $29 fee & picked up paperwork ?

## 2021-06-02 ENCOUNTER — Encounter: Payer: Self-pay | Admitting: *Deleted

## 2021-06-02 ENCOUNTER — Other Ambulatory Visit: Payer: Self-pay | Admitting: *Deleted

## 2021-06-02 ENCOUNTER — Encounter: Payer: Self-pay | Admitting: Cardiothoracic Surgery

## 2021-06-02 ENCOUNTER — Institutional Professional Consult (permissible substitution): Payer: 59 | Admitting: Cardiothoracic Surgery

## 2021-06-02 VITALS — BP 166/85 | HR 78 | Resp 20 | Ht 68.0 in | Wt 204.0 lb

## 2021-06-02 DIAGNOSIS — I25118 Atherosclerotic heart disease of native coronary artery with other forms of angina pectoris: Secondary | ICD-10-CM

## 2021-06-02 DIAGNOSIS — I208 Other forms of angina pectoris: Secondary | ICD-10-CM | POA: Diagnosis not present

## 2021-06-02 MED ORDER — METOPROLOL TARTRATE 25 MG PO TABS: 12.5000 mg | ORAL_TABLET | Freq: Two times a day (BID) | ORAL | 1 refills | Status: DC

## 2021-06-02 MED ORDER — ALPRAZOLAM 1 MG PO TABS
1.0000 mg | ORAL_TABLET | Freq: Every evening | ORAL | 0 refills | Status: DC | PRN
Start: 2021-06-02 — End: 2021-07-04

## 2021-06-02 NOTE — Progress Notes (Signed)
PCP is Pcp, No ?Referring Provider is Iran Ouch, MD ? ?Chief Complaint  ?Patient presents with  ? Coronary Artery Disease  ?  Initial surgical consult, cath 3/8, ECHO 3/22  ? ?Patient examined, images of coronary angiogram and echocardiogram and recent chest x-ray personally reviewed and counseled with patient. ?HPI: ?56 year old non-smoker with hyperlipidemia was evaluated and found to have a high risk calcium score cardiac CT scan.  He was subsequently followed up in the cardiology office for chest pain with exertion.  He was placed on Imdur, Crestor, and aspirin.  He was scheduled for cardiac catheterization. ? ?This was performed on March 8.  He has a high-grade proximal LAD stenosis and a high-grade stenosis of a OM 2 branch of the dominant circumflex.  The RCA is small and and nondominant and is filling via collaterals. ? ?Echocardiogram shows normal LV systolic function.  No significant valvular disease. ? ?Precath chest x-ray shows clear lung fields ? ?Since the heart cath the patient has had some intermittent chest pain insomnia anxiety and financial concern over being held out of work.  He has been compliant with his medications including aspirin, Imdur, Crestor.  He is not placed on a beta-blocker. ? ?Past Medical History:  ?Diagnosis Date  ? High blood pressure   ? High cholesterol   ? ? ?Past Surgical History:  ?Procedure Laterality Date  ? LEFT HEART CATH AND CORONARY ANGIOGRAPHY N/A 05/07/2021  ? Procedure: LEFT HEART CATH AND CORONARY ANGIOGRAPHY;  Surgeon: Iran Ouch, MD;  Location: [facility];  Service: Cardiovascular;  Laterality: N/A;  ? ? ?History reviewed. No pertinent family history. ? ?Social History ?Social History  ? ?Tobacco Use  ? Smoking status: Never  ? Smokeless tobacco: Never  ?Vaping Use  ? Vaping Use: Never used  ?Substance Use Topics  ? Alcohol use: Yes  ? Drug use: Never  ? ? ?Current Outpatient Medications  ?Medication Sig Dispense Refill  ? Ascorbic Acid  (VITAMIN C) 1000 MG tablet Take 1,000 mg by mouth daily.    ? aspirin EC 81 MG tablet Take 1 tablet (81 mg total) by mouth daily. Swallow whole. 90 tablet 3  ? Coenzyme Q10 (COQ-10) 100 MG CAPS Take 100 mg by mouth daily.    ? isosorbide mononitrate (IMDUR) 30 MG 24 hr tablet Take 0.5 tablets (15 mg total) by mouth daily. 15 tablet 3  ? Magnesium 400 MG TABS Take 400 mg by mouth daily.    ? Multiple Vitamin (MULTIVITAMIN WITH MINERALS) TABS tablet Take 1 tablet by mouth daily.    ? rosuvastatin (CRESTOR) 40 MG tablet Take 1 tablet (40 mg total) by mouth daily. 30 tablet 3  ? ?No current facility-administered medications for this visit.  ? ? ?Allergies  ?Allergen Reactions  ? Morphine   ?  Pt states he doesn't want morphine, no reaction documented   ? ? ?Review of Systems: ?     ? ?           ? ?Review of Systems :  [ y ] = yes, [  ] = no ? ?      General :  Weight gain [   ]    Weight loss  [   ]  Fatigue [  ]  Fever [  ]  Chills  [  ]                                 ? ? ?  HEENT    Headache [  ]  Dizziness [  ]  Blurred vision [  ] Glaucoma  [  ]   ?                       Nosebleeds [  ] Painful or loose teeth [  ] ? ?      Cardiac :  Chest pain/ pressure [ y ]  Resting SOB [  ] exertional SOB [  ] ?                       Orthopnea [  ]  Pedal edema  [  ]  Palpitations [  ] Syncope/presyncope  ?                       Paroxysmal nocturnal dyspnea [  ] ? ?       Pulmonary : cough [  ]  wheezing [  ]  Hemoptysis [  ] Sputum [  ] Snoring [  ] ?                             Pneumothorax [  ]  Sleep apnea [  ] ? ?      GI : Vomiting [  ]  Dysphagia [  ]  Melena  [  ]  Abdominal pain [  ] BRBPR [  ] ?             Heart burn [  ]  Constipation [  ] Diarrhea  [  ] Colonoscopy [   ] ? ?      GU : Hematuria [  ]  Dysuria [  ]  Nocturia [  ] UTI's [  ] ? ?      Vascular : Claudication [  ]  Rest pain [  ]  DVT [  ] Vein stripping [  ] leg ulcers [  ] ?                         TIA [  ] Stroke [  ]  Varicose veins [   ] ? ?      NEURO :  Headaches  [  ] Seizures [  ] Vision changes [  ] Paresthesias [  ]                                        ? ?      Musculoskeletal :  Arthritis [  ] Gout  [  ]  Back pain [  ]  Joint pain [  ] ? ?      Skin :  Rash [  ]  Melanoma [  ] Sores [  ] ? ?      Heme : Bleeding problems [  ]Clotting Disorders [  ] Anemia [  ]Blood Transfusion  ? ?      Endocrine : Diabetes [  ] Heat or Cold intolerance [  ] Polyuria [  ]excessive thirst  ? ?      Psych : Depression [  ]  Anxiety [  y]  Psych hospitalizations [  ] Memory change [  ] ?  Insomnia- Y ?The patient went on a keto diet in 2022 and lost almost 80 pounds. ?       ?                   ?         ? ?                   ?         ? ? ?BP (!) 166/85 (BP Location: Right Arm, Patient Position: Sitting)   Pulse 78   Resp 20   Ht 5\' 8"  (1.727 m)   Wt 204 lb (92.5 kg)   SpO2 99% Comment: RA  BMI 31.02 kg/m?  ?Physical Exam: ?    ?Physical Exam ? ?General: Middle-aged well-nourished Hispanic male anxious but no acute distress accompanied by his wife ?HEENT: Normocephalic pupils equal , dentition adequate ?Neck: Supple without JVD, adenopathy, or bruit ?Chest: Clear to auscultation, symmetrical breath sounds, no rhonchi, no tenderness ?            or deformity ?Cardiovascular: Regular rate and rhythm, no murmur, no gallop, peripheral pulses ?            palpable in all extremities ?Abdomen:  Soft, nontender, no palpable mass or organomegaly ?Extremities: Warm, well-perfused, no clubbing cyanosis edema or tenderness, ?             no venous stasis changes of the legs ?Rectal/GU: Deferred ?Neuro: Grossly non--focal and symmetrical throughout ?Skin: Clean and dry without rash or ulceration ? ? ?Diagnostic Tests: ?Cardiac cath images personally reviewed showing significant three-vessel coronary disease with preserved LV function as noted above ? ?Impression: ?Exertional angina ?Three-vessel coronary  disease ?Hypertension ?Hyperlipidemia ? ? ?Plan: ?Patient be scheduled for multivessel CABG on Monday, April 10 at [facility].  Grafts will be planned to the LAD, OM, and possibly the nondominant RCA.  I discussed the benefits and risk of surgery with the patient and his wife.  He understands the procedure will improve his angina and preserved LV function by protecting from an MI.  He understands the risk to include bleeding, stroke, infection, organ failure, death.  He agrees to proceed with surgery under what I feel is an informed consent. ? ? ?[facility], MD ?[facility] ?[phone] ? ? ? ? ? ?

## 2021-06-03 ENCOUNTER — Telehealth: Payer: Self-pay | Admitting: Licensed Clinical Social Worker

## 2021-06-03 NOTE — Telephone Encounter (Signed)
CSW received referral from [facility] staff to contact patient who has some financial concerns regarding upcoming surgery. Patient states he is unable to work due to upcoming surgery and is not eligible for short  term disability due to not having been at the company a year. CSW discussed [employer] and that patient would most likely have to follow up post surgery and after insurance billing to obtain out of pocket responsibility. CSW also discussed Patient Care Fund assistance if he should not be able to make household bills during recovery. CSW explained the process and possible support if needed. Patient was grateful for the call and the information. He states he will follow up with CSW if needed post surgery. CSW available as needed. Lasandra Beech, LCSW, CCSW-MCS [phone] ? ?

## 2021-06-04 NOTE — Progress Notes (Signed)
Surgical Instructions ? ? ? Your procedure is scheduled on 06/09/21. ? Report to [facility] Main Entrance "A" at 5:30 A.M., then check in with the Admitting office. ? Call this number if you have problems the morning of surgery: ? [phone] ? ? If you have any questions prior to your surgery date call [phone]: Open Monday-Friday 8am-4pm ? ? ? Remember: ? Do not eat or drink after midnight the night before your surgery ? ? ?  ? Take these medicines the morning of surgery with A SIP OF WATER:  ?isosorbide mononitrate (IMDUR) ?metoprolol tartrate (LOPRESSOR) ?rosuvastatin (CRESTOR) ? ? ?As of today, STOP taking any Aleve, Naproxen, Ibuprofen, Motrin, Advil, Goody's, BC's, all herbal medications, fish oil, and all vitamins. ? ?         ?Do not wear jewelry  ?Do not wear lotions, powders, colognes, or deodorant. ?Do not shave 48 hours prior to surgery.  Men may shave face and neck. ?Do not bring valuables to the hospital. ? ? ?[facility] is not responsible for any belongings or valuables. .  ? ?Do NOT Smoke (Tobacco/Vaping)  24 hours prior to your procedure ? ?If you use a CPAP at night, you may bring your mask for your overnight stay. ?  ?Contacts, glasses, hearing aids, dentures or partials may not be worn into surgery, please bring cases for these belongings ?  ?For patients admitted to the hospital, discharge time will be determined by your treatment team. ?  ?Patients discharged the day of surgery will not be allowed to drive home, and someone needs to stay with them for 24 hours. ? ? ?SURGICAL WAITING ROOM VISITATION ?Patients having surgery or a procedure in a hospital may have two support people. ?Children under the age of 60 must have an adult with them who is not the patient. ?They may stay in the waiting area during the procedure and may switch out with other visitors. If the patient needs to stay at the hospital during part of their recovery, the visitor guidelines for inpatient rooms apply. ? ?Please  refer to the [facility] website for the visitor guidelines for Inpatients (after your surgery is over and you are in a regular room).  ? ? ?Special instructions:   ? ?Oral Hygiene is also important to reduce your risk of infection.  Remember - BRUSH YOUR TEETH THE MORNING OF SURGERY WITH YOUR REGULAR TOOTHPASTE ? ? ?[facility]- Preparing For Surgery ? ?Before surgery, you can play an important role. Because skin is not sterile, your skin needs to be as free of germs as possible. You can reduce the number of germs on your skin by washing with CHG (chlorahexidine gluconate) Soap before surgery.  CHG is an antiseptic cleaner which kills germs and bonds with the skin to continue killing germs even after washing.   ? ? ?Please do not use if you have an allergy to CHG or antibacterial soaps. If your skin becomes reddened/irritated stop using the CHG.  ?Do not shave (including legs and underarms) for at least 48 hours prior to first CHG shower. It is OK to shave your face. ? ?Please follow these instructions carefully. ?  ? ? Shower the NIGHT BEFORE SURGERY and the MORNING OF SURGERY with CHG Soap.  ? If you chose to wash your hair, wash your hair first as usual with your normal shampoo. After you shampoo, rinse your hair and body thoroughly to remove the shampoo.  Then [employer] and genitals (private parts) with your  normal soap and rinse thoroughly to remove soap. ? ?After that Use CHG Soap as you would any other liquid soap. You can apply CHG directly to the skin and wash gently with a scrungie or a clean washcloth.  ? ?Apply the CHG Soap to your body ONLY FROM THE NECK DOWN.  Do not use on open wounds or open sores. Avoid contact with your eyes, ears, mouth and genitals (private parts). Wash Face and genitals (private parts)  with your normal soap.  ? ?Wash thoroughly, paying special attention to the area where your surgery will be performed. ? ?Thoroughly rinse your body with warm water from the neck down. ? ?DO NOT  shower/wash with your normal soap after using and rinsing off the CHG Soap. ? ?Pat yourself dry with a CLEAN TOWEL. ? ?Wear CLEAN PAJAMAS to bed the night before surgery ? ?Place CLEAN SHEETS on your bed the night before your surgery ? ?DO NOT SLEEP WITH PETS. ? ? ?Day of Surgery: ? ?Take a shower with CHG soap. ?Wear Clean/Comfortable clothing the morning of surgery ?Do not apply any deodorants/lotions.   ?Remember to brush your teeth WITH YOUR REGULAR TOOTHPASTE. ? ? ? ?If you received a COVID test during your pre-op visit  it is requested that you wear a mask when out in public, stay away from anyone that may not be feeling well and notify your surgeon if you develop symptoms. If you have been in contact with anyone that has tested positive in the last 10 days please notify you surgeon. ? ?  ?Please read over the following fact sheets that you were given.   ?

## 2021-06-05 ENCOUNTER — Ambulatory Visit (HOSPITAL_COMMUNITY)
Admission: RE | Admit: 2021-06-05 | Discharge: 2021-06-05 | Disposition: A | Discharge status: Home / Self Care | Payer: 59 | Source: Ambulatory Visit | Attending: Cardiothoracic Surgery | Admitting: Cardiothoracic Surgery

## 2021-06-05 ENCOUNTER — Ambulatory Visit (HOSPITAL_COMMUNITY)
Admission: RE | Admit: 2021-06-05 | Discharge: 2021-06-05 | Disposition: A | Discharge status: Home / Self Care | Payer: 59 | Source: Ambulatory Visit | Attending: Thoracic Surgery (Cardiothoracic Vascular Surgery) | Admitting: Thoracic Surgery (Cardiothoracic Vascular Surgery)

## 2021-06-05 ENCOUNTER — Encounter (HOSPITAL_COMMUNITY): Payer: Self-pay

## 2021-06-05 ENCOUNTER — Encounter (HOSPITAL_COMMUNITY)
Admission: RE | Admit: 2021-06-05 | Discharge: 2021-06-05 | Disposition: A | Discharge status: Home / Self Care | Payer: 59 | Source: Ambulatory Visit | Attending: Cardiothoracic Surgery | Admitting: Cardiothoracic Surgery

## 2021-06-05 ENCOUNTER — Other Ambulatory Visit: Payer: Self-pay

## 2021-06-05 VITALS — BP 145/70 | HR 71 | Temp 98.6°F | Resp 18 | Ht 68.0 in | Wt 197.3 lb

## 2021-06-05 DIAGNOSIS — Z01812 Encounter for preprocedural laboratory examination: Secondary | ICD-10-CM | POA: Insufficient documentation

## 2021-06-05 DIAGNOSIS — Z01818 Encounter for other preprocedural examination: Secondary | ICD-10-CM

## 2021-06-05 DIAGNOSIS — I25118 Atherosclerotic heart disease of native coronary artery with other forms of angina pectoris: Secondary | ICD-10-CM | POA: Insufficient documentation

## 2021-06-05 DIAGNOSIS — Z20822 Contact with and (suspected) exposure to covid-19: Secondary | ICD-10-CM | POA: Insufficient documentation

## 2021-06-05 DIAGNOSIS — E7800 Pure hypercholesterolemia, unspecified: Secondary | ICD-10-CM | POA: Insufficient documentation

## 2021-06-05 DIAGNOSIS — I10 Essential (primary) hypertension: Secondary | ICD-10-CM | POA: Insufficient documentation

## 2021-06-05 HISTORY — DX: Atherosclerotic heart disease of native coronary artery without angina pectoris: I25.10

## 2021-06-05 LAB — COMPREHENSIVE METABOLIC PANEL
ALT: 25 U/L (ref 0–44)
AST: 23 U/L (ref 15–41)
Albumin: 4.8 g/dL (ref 3.5–5.0)
Alkaline Phosphatase: 40 U/L (ref 38–126)
Anion gap: 10 (ref 5–15)
BUN: 19 mg/dL (ref 6–20)
CO2: 21 mmol/L — ABNORMAL LOW (ref 22–32)
Calcium: 9.7 mg/dL (ref 8.9–10.3)
Chloride: 105 mmol/L (ref 98–111)
Creatinine, Ser: 0.96 mg/dL (ref 0.61–1.24)
GFR, Estimated: >60 mL/min (ref >60)
Glucose, Bld: 89 mg/dL (ref 70–99)
Potassium: 3.9 mmol/L (ref 3.5–5.1)
Sodium: 136 mmol/L (ref 135–145)
Total Bilirubin: 0.9 mg/dL (ref 0.3–1.2)
Total Protein: 7.3 g/dL (ref 6.5–8.1)

## 2021-06-05 LAB — TYPE AND SCREEN
ABO/RH(D): A POS
Antibody Screen: NEGATIVE

## 2021-06-05 LAB — URINALYSIS, ROUTINE W REFLEX MICROSCOPIC
Bilirubin Urine: NEGATIVE
Glucose, UA: NEGATIVE mg/dL
Hgb urine dipstick: NEGATIVE
Ketones, ur: NEGATIVE mg/dL
Leukocytes,Ua: NEGATIVE
Nitrite: NEGATIVE
Protein, ur: NEGATIVE mg/dL
Specific Gravity, Urine: 1.01 (ref 1.005–1.030)
pH: 5 (ref 5.0–8.0)

## 2021-06-05 LAB — BLOOD GAS, ARTERIAL
Acid-Base Excess: 3 mmol/L — ABNORMAL HIGH (ref 0.0–2.0)
Bicarbonate: 25.5 mmol/L (ref 20.0–28.0)
Drawn by: 60286
O2 Saturation: 97.1 %
Patient temperature: 37
pCO2 arterial: 32 mmHg (ref 32–48)
pH, Arterial: 7.51 — ABNORMAL HIGH (ref 7.35–7.45)
pO2, Arterial: 131 mmHg — ABNORMAL HIGH (ref 83–108)

## 2021-06-05 LAB — SURGICAL PCR SCREEN
MRSA, PCR: NEGATIVE
Staphylococcus aureus: NEGATIVE

## 2021-06-05 LAB — CBC
HCT: 42.7 % (ref 39.0–52.0)
Hemoglobin: 14.3 g/dL (ref 13.0–17.0)
MCH: 31.1 pg (ref 26.0–34.0)
MCHC: 33.5 g/dL (ref 30.0–36.0)
MCV: 92.8 fL (ref 80.0–100.0)
Platelets: 315 10*3/uL (ref 150–400)
RBC: 4.6 MIL/uL (ref 4.22–5.81)
RDW: 13.2 % (ref 11.5–15.5)
WBC: 7.6 10*3/uL (ref 4.0–10.5)
nRBC: 0 % (ref 0.0–0.2)

## 2021-06-05 LAB — PROTIME-INR
INR: 1 (ref 0.8–1.2)
Prothrombin Time: 12.6 seconds (ref 11.4–15.2)

## 2021-06-05 LAB — HEMOGLOBIN A1C
Hgb A1c MFr Bld: 5.3 % (ref 4.8–5.6)
Mean Plasma Glucose: 105.41 mg/dL

## 2021-06-05 LAB — APTT: aPTT: 25 seconds (ref 24–36)

## 2021-06-05 NOTE — Progress Notes (Signed)
PCP - Cleon Dew, FNP ?Cardiologist - Dr. Debbe Odea ? ?PPM/ICD - Denies ? ?Chest x-ray - 06/05/21 ?EKG - 06/05/21 ?Stress Test - Denies ?ECHO - 05/21/21 ?Cardiac Cath - 05/07/21 ? ?Sleep Study - Denies ? ?Patient denies having diabetes. ? ?Blood Thinner Instructions: N/A ?Aspirin Instructions: Follow surgeon's instructions ? ?ERAS Protcol - No ? ?COVID TEST- 06/05/21 in PAT ? ? ?Anesthesia review: Yes, cardiac hx ? ?Patient denies shortness of breath, fever, cough and chest pain at PAT appointment ? ? ?All instructions explained to the patient, with a verbal understanding of the material. Patient agrees to go over the instructions while at home for a better understanding. Patient also instructed to self quarantine after being tested for COVID-19. The opportunity to ask questions was provided. ? ? ?

## 2021-06-06 ENCOUNTER — Encounter (HOSPITAL_COMMUNITY): Payer: Self-pay

## 2021-06-06 LAB — SARS CORONAVIRUS 2 (TAT 6-24 HRS): SARS Coronavirus 2: NEGATIVE

## 2021-06-06 MED ORDER — TRANEXAMIC ACID (OHS) PUMP PRIME SOLUTION
2.0000 mg/kg | INTRAVENOUS | Status: DC
  Filled 2021-06-06: qty 1.79

## 2021-06-06 MED ORDER — NITROGLYCERIN IN D5W 200-5 MCG/ML-% IV SOLN
2.0000 ug/min | INTRAVENOUS | Status: DC
  Filled 2021-06-06: qty 250

## 2021-06-06 MED ORDER — MILRINONE LACTATE IN DEXTROSE 20-5 MG/100ML-% IV SOLN
0.3000 ug/kg/min | INTRAVENOUS | Status: DC
Start: 2021-06-09 — End: 2021-06-09
  Filled 2021-06-06: qty 100

## 2021-06-06 MED ORDER — CEFAZOLIN SODIUM-DEXTROSE 2-4 GM/100ML-% IV SOLN
2.0000 g | INTRAVENOUS | Status: AC
  Administered 2021-06-09 (×2): 2 g via INTRAVENOUS
  Filled 2021-06-06: qty 100

## 2021-06-06 MED ORDER — HEPARIN 30,000 UNITS/1000 ML (OHS) CELLSAVER SOLUTION
Status: DC
  Filled 2021-06-06: qty 1000

## 2021-06-06 MED ORDER — PAPAVERINE HCL 30 MG/ML IJ SOLN
INTRAMUSCULAR | Status: DC
  Filled 2021-06-06: qty 2.5

## 2021-06-06 MED ORDER — VANCOMYCIN HCL 1500 MG/300ML IV SOLN
1500.0000 mg | INTRAVENOUS | Status: AC
  Administered 2021-06-09: 1500 mg via INTRAVENOUS
  Filled 2021-06-06: qty 300

## 2021-06-06 MED ORDER — INSULIN REGULAR(HUMAN) IN NACL 100-0.9 UT/100ML-% IV SOLN
INTRAVENOUS | Status: AC
  Administered 2021-06-09: 1.7 [IU]/h via INTRAVENOUS
  Filled 2021-06-06: qty 100

## 2021-06-06 MED ORDER — TRANEXAMIC ACID 1000 MG/10ML IV SOLN
1.5000 mg/kg/h | INTRAVENOUS | Status: AC
Start: 2021-06-09 — End: 2021-06-09
  Administered 2021-06-09: 1.5 mg/kg/h via INTRAVENOUS
  Filled 2021-06-06: qty 25

## 2021-06-06 MED ORDER — NOREPINEPHRINE 4 MG/250ML-% IV SOLN
0.0000 ug/min | INTRAVENOUS | Status: DC
  Filled 2021-06-06: qty 250

## 2021-06-06 MED ORDER — DEXMEDETOMIDINE HCL IN NACL 400 MCG/100ML IV SOLN
0.1000 ug/kg/h | INTRAVENOUS | Status: AC
  Administered 2021-06-09: .3 ug/kg/h via INTRAVENOUS
  Filled 2021-06-06: qty 100

## 2021-06-06 MED ORDER — POTASSIUM CHLORIDE 2 MEQ/ML IV SOLN
80.0000 meq | INTRAVENOUS | Status: DC
  Filled 2021-06-06: qty 40

## 2021-06-06 MED ORDER — EPINEPHRINE HCL 5 MG/250ML IV SOLN IN NS
0.0000 ug/min | INTRAVENOUS | Status: DC
  Filled 2021-06-06: qty 250

## 2021-06-06 MED ORDER — CEFAZOLIN SODIUM-DEXTROSE 2-4 GM/100ML-% IV SOLN
2.0000 g | INTRAVENOUS | Status: DC
  Filled 2021-06-06: qty 100

## 2021-06-06 MED ORDER — TRANEXAMIC ACID (OHS) BOLUS VIA INFUSION
15.0000 mg/kg | INTRAVENOUS | Status: AC
Start: 2021-06-09 — End: 2021-06-09
  Administered 2021-06-09: 1342.5 mg via INTRAVENOUS
  Filled 2021-06-06: qty 1343

## 2021-06-06 MED ORDER — MAGNESIUM SULFATE 50 % IJ SOLN
40.0000 meq | INTRAMUSCULAR | Status: DC
  Filled 2021-06-06: qty 9.85

## 2021-06-06 MED ORDER — PHENYLEPHRINE HCL-NACL 20-0.9 MG/250ML-% IV SOLN
30.0000 ug/min | INTRAVENOUS | Status: AC
  Administered 2021-06-09: 20 ug/min via INTRAVENOUS
  Filled 2021-06-06: qty 250

## 2021-06-06 NOTE — Anesthesia Preprocedure Evaluation (Addendum)
Anesthesia Evaluation  ?Patient identified by MRN, date of birth, ID band ?Patient awake ? ? ? ?Reviewed: ?Allergy & Precautions, H&P , NPO status , Patient's Chart, lab work & pertinent test results ? ?Airway ?Mallampati: II ? ?TM Distance: >3 FB ?Neck ROM: Full ? ? ? Dental ?no notable dental hx. ?(+) Teeth Intact, Dental Advisory Given ?  ?Pulmonary ?neg pulmonary ROS,  ?  ?Pulmonary exam normal ?breath sounds clear to auscultation ? ? ? ? ? ? Cardiovascular ?Exercise Tolerance: Good ?hypertension, Pt. on medications and Pt. on home beta blockers ?+ CAD  ? ?Rhythm:Regular Rate:Normal ? ? ?  ?Neuro/Psych ?negative neurological ROS ? negative psych ROS  ? GI/Hepatic ?negative GI ROS, Neg liver ROS,   ?Endo/Other  ?negative endocrine ROS ? Renal/GU ?negative Renal ROS  ?negative genitourinary ?  ?Musculoskeletal ? ? Abdominal ?  ?Peds ? Hematology ?negative hematology ROS ?(+)   ?Anesthesia Other Findings ? ? Reproductive/Obstetrics ?negative OB ROS ? ?  ? ? ? ? ? ? ? ? ? ? ? ? ? ?  ?  ? ? ? ? ? ? ?Anesthesia Physical ?Anesthesia Plan ? ?ASA: 4 ? ?Anesthesia Plan: General  ? ?Post-op Pain Management: Tylenol PO (pre-op)*  ? ?Induction: Intravenous ? ?PONV Risk Score and Plan: 2 and Ondansetron and Midazolam ? ?Airway Management Planned: Oral ETT ? ?Additional Equipment: Arterial line, CVP, PA Cath, TEE and Ultrasound Guidance Line Placement ? ?Intra-op Plan:  ? ?Post-operative Plan: Post-operative intubation/ventilation ? ?Informed Consent: I have reviewed the patients History and Physical, chart, labs and discussed the procedure including the risks, benefits and alternatives for the proposed anesthesia with the patient or authorized representative who has indicated his/her understanding and acceptance.  ? ? ? ?Dental advisory given ? ?Plan Discussed with: CRNA ? ?Anesthesia Plan Comments: (PAT note written 06/06/2021 by Shonna Chock, PA-C. ?)  ? ? ? ? ?Anesthesia Quick  Evaluation ? ?

## 2021-06-06 NOTE — Progress Notes (Signed)
Anesthesia Chart Review: ? Case: [id number] Date/Time: 06/09/21 0715  ? Procedures:  ?    CORONARY ARTERY BYPASS GRAFTING (CABG) (Chest) ?    TRANSESOPHAGEAL ECHOCARDIOGRAM (TEE)  ? Anesthesia type: General  ? Pre-op diagnosis: CAD  ? [facility]: [facility] ROOM [facility] / [facility]  ? Surgeons: Lovett Sox, MD  ? ?  ? ? ?DISCUSSION: Patient is a 56 year old male scheduled for the above procedure. ? ?History includes never smoker, HTN, hypercholesterolemia, CAD. ? ?Preoperative EKG and labs reviewed.  06/05/2021 chest x-ray is still in process.  06/05/2021 presurgical COVID-19 test negative. ? ? ?US Carotid 06/05/21: ?Summary:  ?- Right Carotid: The extracranial vessels were near-normal with only minimal  ?wall  ?               thickening or plaque.  ?- Left Carotid: The extracranial vessels were near-normal with only minimal  ?wall  ?              thickening or plaque.  ?- Vertebrals:  Bilateral vertebral arteries demonstrate antegrade flow.  ?- Subclavians: Normal flow hemodynamics were seen in bilateral subclavian  ?             arteries.  ? ? ?Echo 05/21/21: ?IMPRESSIONS  ? 1. Left ventricular ejection fraction, by estimation, is 60 to 65%. Left  ?ventricular ejection fraction by 2D MOD biplane is 63.9 %. The left  ?ventricle has normal function. The left ventricle has no regional wall  ?motion abnormalities. Left ventricular  ?diastolic parameters were normal.  ? 2. Right ventricular systolic function is normal. The right ventricular  ?size is not well visualized.  ? 3. The mitral valve is normal in structure. No evidence of mitral valve  ?regurgitation.  ? 4. The aortic valve is tricuspid. Aortic valve regurgitation is not  ?visualized.  ? 5. The inferior vena cava is normal in size with greater than 50%  ?respiratory variability, suggesting right atrial pressure of 3 mmHg. ? ? ?Cardiac cath 05/07/21: ?  Mid Cx to Dist Cx lesion is 80% stenosed. ?  Prox LAD to Mid LAD lesion is 90% stenosed. ?  Mid LAD lesion is 40% stenosed. ?  Ost  LAD to Prox LAD lesion is 30% stenosed. ?  Mid RCA lesion is 100% stenosed. ?  The left ventricular systolic function is normal. ?  LV end diastolic pressure is mildly elevated. ?  The left ventricular ejection fraction is 55-65% by visual estimate. ?  ?1.  Codominant coronary arteries with significant three-vessel coronary artery disease. ?2.  Normal LV systolic function mildly elevated left ventricular end-diastolic pressure. ? ? ?Anesthesia team to evaluate on the day of surgery. ? ?VS: BP (!) 145/70   Pulse 71   Temp 37 ?C (Oral)   Resp 18   Ht 5\' 8"  (1.727 m)   Wt 89.5 kg   SpO2 100%   BMI 30.00 kg/m?  ? ?PROVIDERS: ? , FNP is PCP  ?Cleon Dew, MD is cardiologist ? ? ?LABS: Labs reviewed: Acceptable for surgery. ?(all labs ordered are listed, but only abnormal results are displayed) ? ?Labs Reviewed  ?COMPREHENSIVE METABOLIC PANEL - Abnormal; Notable for the following components:  ?    Result Value  ? CO2 21 (*)   ? All other components within normal limits  ?BLOOD GAS, ARTERIAL - Abnormal; Notable for the following components:  ? pH, Arterial 7.51 (*)   ? pO2, Arterial 131 (*)   ? Acid-Base Excess 3.0 (*)   ?  All other components within normal limits  ?SURGICAL PCR SCREEN  ?SARS CORONAVIRUS 2 (TAT 6-24 HRS)  ?CBC  ?PROTIME-INR  ?APTT  ?URINALYSIS, ROUTINE W REFLEX MICROSCOPIC  ?HEMOGLOBIN A1C  ?TYPE AND SCREEN  ? ? ?Past Medical History:  ?Diagnosis Date  ? Coronary artery disease   ? High blood pressure   ? High cholesterol   ? ? ?Past Surgical History:  ?Procedure Laterality Date  ? LEFT HEART CATH AND CORONARY ANGIOGRAPHY N/A 05/07/2021  ? Procedure: LEFT HEART CATH AND CORONARY ANGIOGRAPHY;  Surgeon: Iran Ouch, MD;  Location: [facility];  Service: Cardiovascular;  Laterality: N/A;  ? ? ?MEDICATIONS: ? ALPRAZolam (XANAX) 1 MG tablet  ? Ascorbic Acid (VITAMIN C) 1000 MG tablet  ? aspirin EC 81 MG tablet  ? Coenzyme Q10 (COQ-10) 100 MG CAPS  ? isosorbide  mononitrate (IMDUR) 30 MG 24 hr tablet  ? Magnesium 400 MG TABS  ? Menaquinone-7 (K2 PO)  ? metoprolol tartrate (LOPRESSOR) 25 MG tablet  ? Multiple Vitamin (MULTIVITAMIN WITH MINERALS) TABS tablet  ? OMEGA-3 FATTY ACIDS PO  ? rosuvastatin (CRESTOR) 40 MG tablet  ? ?No current facility-administered medications for this encounter.  ? ? [START ON 06/09/2021] ceFAZolin (ANCEF) IVPB 2g/100 mL premix  ? [START ON 06/09/2021] ceFAZolin (ANCEF) IVPB 2g/100 mL premix  ? [START ON 06/09/2021] dexmedetomidine (PRECEDEX) 400 MCG/100ML (4 mcg/mL) infusion  ? [START ON 06/09/2021] EPINEPHrine (ADRENALIN) 5 mg in NS 250 mL (0.02 mg/mL) premix infusion  ? [START ON 06/09/2021] heparin 30,000 units/NS 1000 mL solution for CELLSAVER  ? [START ON 06/09/2021] heparin sodium (porcine) 2,500 Units, papaverine 30 mg in electrolyte-A (PLASMALYTE-A PH 7.4) 500 mL irrigation  ? [START ON 06/09/2021] insulin regular, human (MYXREDLIN) 100 units/ 100 mL infusion  ? [START ON 06/09/2021] magnesium sulfate (IV Push/IM) injection 40 mEq  ? [START ON 06/09/2021] milrinone (PRIMACOR) 20 MG/100 ML (0.2 mg/mL) infusion  ? [START ON 06/09/2021] nitroGLYCERIN 50 mg in dextrose 5 % 250 mL (0.2 mg/mL) infusion  ? [START ON 06/09/2021] norepinephrine (LEVOPHED) 4mg  in (0.016 mg/mL) premix infusion  ? [START ON 06/09/2021] phenylephrine (NEO-SYNEPHRINE) 20mg /NS 08/09/2021 premix infusion  ? [START ON 06/09/2021] potassium chloride injection 80 mEq  ? [START ON 06/09/2021] tranexamic acid (CYKLOKAPRON) 2,500 mg in sodium chloride 0.9 % 250 mL (10 mg/mL) infusion  ? [START ON 06/09/2021] tranexamic acid (CYKLOKAPRON) bolus via infusion - over 30 minutes 1,342.5 mg  ? [START ON 06/09/2021] tranexamic acid (CYKLOKAPRON) pump prime solution 179 mg  ? [START ON 06/09/2021] vancomycin (VANCOREADY) IVPB 1500 mg/300 mL  ? ?08/09/2021, PA-C ?Surgical Short Stay/Anesthesiology ?[facility] Phone [phone] ?[facility] Phone [phone] ?06/06/2021 10:53 AM ? ? ? ? ? ? ? ?

## 2021-06-09 ENCOUNTER — Inpatient Hospital Stay (HOSPITAL_COMMUNITY): Payer: 59

## 2021-06-09 ENCOUNTER — Inpatient Hospital Stay (HOSPITAL_COMMUNITY)
Admission: RE | Admit: 2021-06-09 | Discharge: 2021-06-14 | DRG: 236 | Disposition: A | Discharge status: Home / Self Care | Payer: 59 | Attending: Cardiothoracic Surgery | Admitting: Cardiothoracic Surgery

## 2021-06-09 ENCOUNTER — Encounter (HOSPITAL_COMMUNITY): Payer: Self-pay | Admitting: Cardiothoracic Surgery

## 2021-06-09 ENCOUNTER — Inpatient Hospital Stay (HOSPITAL_COMMUNITY): Payer: 59 | Admitting: Physician Assistant

## 2021-06-09 ENCOUNTER — Other Ambulatory Visit: Payer: Self-pay

## 2021-06-09 ENCOUNTER — Inpatient Hospital Stay (HOSPITAL_COMMUNITY)
Admission: RE | Disposition: A | Discharge status: Home / Self Care | Payer: Self-pay | Source: Home / Self Care | Attending: Cardiothoracic Surgery

## 2021-06-09 DIAGNOSIS — D62 Acute posthemorrhagic anemia: Secondary | ICD-10-CM | POA: Diagnosis not present

## 2021-06-09 DIAGNOSIS — I10 Essential (primary) hypertension: Secondary | ICD-10-CM | POA: Diagnosis not present

## 2021-06-09 DIAGNOSIS — F419 Anxiety disorder, unspecified: Secondary | ICD-10-CM | POA: Diagnosis not present

## 2021-06-09 DIAGNOSIS — I493 Ventricular premature depolarization: Secondary | ICD-10-CM | POA: Diagnosis not present

## 2021-06-09 DIAGNOSIS — Z7982 Long term (current) use of aspirin: Secondary | ICD-10-CM | POA: Diagnosis not present

## 2021-06-09 DIAGNOSIS — I2510 Atherosclerotic heart disease of native coronary artery without angina pectoris: Secondary | ICD-10-CM | POA: Diagnosis present

## 2021-06-09 DIAGNOSIS — Z79899 Other long term (current) drug therapy: Secondary | ICD-10-CM | POA: Diagnosis not present

## 2021-06-09 DIAGNOSIS — I25110 Atherosclerotic heart disease of native coronary artery with unstable angina pectoris: Secondary | ICD-10-CM | POA: Diagnosis not present

## 2021-06-09 DIAGNOSIS — Z885 Allergy status to narcotic agent status: Secondary | ICD-10-CM

## 2021-06-09 DIAGNOSIS — E8770 Fluid overload, unspecified: Secondary | ICD-10-CM | POA: Diagnosis not present

## 2021-06-09 DIAGNOSIS — E7800 Pure hypercholesterolemia, unspecified: Secondary | ICD-10-CM | POA: Diagnosis present

## 2021-06-09 DIAGNOSIS — K5900 Constipation, unspecified: Secondary | ICD-10-CM | POA: Diagnosis not present

## 2021-06-09 DIAGNOSIS — I25118 Atherosclerotic heart disease of native coronary artery with other forms of angina pectoris: Secondary | ICD-10-CM

## 2021-06-09 DIAGNOSIS — G4700 Insomnia, unspecified: Secondary | ICD-10-CM | POA: Diagnosis present

## 2021-06-09 DIAGNOSIS — Z951 Presence of aortocoronary bypass graft: Principal | ICD-10-CM

## 2021-06-09 HISTORY — PX: CORONARY ARTERY BYPASS GRAFT: SHX141

## 2021-06-09 HISTORY — PX: TEE WITHOUT CARDIOVERSION: SHX5443

## 2021-06-09 LAB — POCT I-STAT, CHEM 8
BUN: 19 mg/dL (ref 6–20)
BUN: 18 mg/dL (ref 6–20)
BUN: 17 mg/dL (ref 6–20)
BUN: 16 mg/dL (ref 6–20)
BUN: 16 mg/dL (ref 6–20)
Calcium, Ion: 1.25 mmol/L (ref 1.15–1.40)
Calcium, Ion: 1.27 mmol/L (ref 1.15–1.40)
Calcium, Ion: 1.13 mmol/L — ABNORMAL LOW (ref 1.15–1.40)
Calcium, Ion: 1.11 mmol/L — ABNORMAL LOW (ref 1.15–1.40)
Calcium, Ion: 1.16 mmol/L (ref 1.15–1.40)
Chloride: 104 mmol/L (ref 98–111)
Chloride: 104 mmol/L (ref 98–111)
Chloride: 103 mmol/L (ref 98–111)
Chloride: 103 mmol/L (ref 98–111)
Chloride: 103 mmol/L (ref 98–111)
Creatinine, Ser: 0.8 mg/dL (ref 0.61–1.24)
Creatinine, Ser: 0.7 mg/dL (ref 0.61–1.24)
Creatinine, Ser: 0.6 mg/dL — ABNORMAL LOW (ref 0.61–1.24)
Creatinine, Ser: 0.6 mg/dL — ABNORMAL LOW (ref 0.61–1.24)
Creatinine, Ser: 0.7 mg/dL (ref 0.61–1.24)
Glucose, Bld: 106 mg/dL — ABNORMAL HIGH (ref 70–99)
Glucose, Bld: 108 mg/dL — ABNORMAL HIGH (ref 70–99)
Glucose, Bld: 137 mg/dL — ABNORMAL HIGH (ref 70–99)
Glucose, Bld: 141 mg/dL — ABNORMAL HIGH (ref 70–99)
Glucose, Bld: 155 mg/dL — ABNORMAL HIGH (ref 70–99)
HCT: 37 % — ABNORMAL LOW (ref 39.0–52.0)
HCT: 37 % — ABNORMAL LOW (ref 39.0–52.0)
HCT: 31 % — ABNORMAL LOW (ref 39.0–52.0)
HCT: 28 % — ABNORMAL LOW (ref 39.0–52.0)
HCT: 33 % — ABNORMAL LOW (ref 39.0–52.0)
Hemoglobin: 12.6 g/dL — ABNORMAL LOW (ref 13.0–17.0)
Hemoglobin: 12.6 g/dL — ABNORMAL LOW (ref 13.0–17.0)
Hemoglobin: 10.5 g/dL — ABNORMAL LOW (ref 13.0–17.0)
Hemoglobin: 11.2 g/dL — ABNORMAL LOW (ref 13.0–17.0)
Hemoglobin: 9.5 g/dL — ABNORMAL LOW (ref 13.0–17.0)
Potassium: 3.8 mmol/L (ref 3.5–5.1)
Potassium: 4.3 mmol/L (ref 3.5–5.1)
Potassium: 4.4 mmol/L (ref 3.5–5.1)
Potassium: 4.2 mmol/L (ref 3.5–5.1)
Potassium: 4.7 mmol/L (ref 3.5–5.1)
Sodium: 140 mmol/L (ref 135–145)
Sodium: 139 mmol/L (ref 135–145)
Sodium: 139 mmol/L (ref 135–145)
Sodium: 136 mmol/L (ref 135–145)
Sodium: 137 mmol/L (ref 135–145)
TCO2: 25 mmol/L (ref 22–32)
TCO2: 24 mmol/L (ref 22–32)
TCO2: 26 mmol/L (ref 22–32)
TCO2: 25 mmol/L (ref 22–32)
TCO2: 26 mmol/L (ref 22–32)

## 2021-06-09 LAB — BASIC METABOLIC PANEL
Anion gap: 8 (ref 5–15)
BUN: 15 mg/dL (ref 6–20)
CO2: 20 mmol/L — ABNORMAL LOW (ref 22–32)
Calcium: 7.9 mg/dL — ABNORMAL LOW (ref 8.9–10.3)
Chloride: 108 mmol/L (ref 98–111)
Creatinine, Ser: 0.81 mg/dL (ref 0.61–1.24)
GFR, Estimated: >60 mL/min (ref >60)
Glucose, Bld: 114 mg/dL — ABNORMAL HIGH (ref 70–99)
Potassium: 4 mmol/L (ref 3.5–5.1)
Sodium: 136 mmol/L (ref 135–145)

## 2021-06-09 LAB — POCT I-STAT 7, (LYTES, BLD GAS, ICA,H+H)
Acid-Base Excess: 0 mmol/L (ref 0.0–2.0)
Acid-Base Excess: 1 mmol/L (ref 0.0–2.0)
Acid-Base Excess: 0 mmol/L (ref 0.0–2.0)
Acid-base deficit: 1 mmol/L (ref 0.0–2.0)
Acid-base deficit: 4 mmol/L — ABNORMAL HIGH (ref 0.0–2.0)
Acid-base deficit: 4 mmol/L — ABNORMAL HIGH (ref 0.0–2.0)
Bicarbonate: 25.7 mmol/L (ref 20.0–28.0)
Bicarbonate: 25.5 mmol/L (ref 20.0–28.0)
Bicarbonate: 23.9 mmol/L (ref 20.0–28.0)
Bicarbonate: 26.6 mmol/L (ref 20.0–28.0)
Bicarbonate: 22.3 mmol/L (ref 20.0–28.0)
Bicarbonate: 21.8 mmol/L (ref 20.0–28.0)
Calcium, Ion: 1.25 mmol/L (ref 1.15–1.40)
Calcium, Ion: 1.06 mmol/L — ABNORMAL LOW (ref 1.15–1.40)
Calcium, Ion: 1.16 mmol/L (ref 1.15–1.40)
Calcium, Ion: 1.12 mmol/L — ABNORMAL LOW (ref 1.15–1.40)
Calcium, Ion: 1.11 mmol/L — ABNORMAL LOW (ref 1.15–1.40)
Calcium, Ion: 1.2 mmol/L (ref 1.15–1.40)
HCT: 37 % — ABNORMAL LOW (ref 39.0–52.0)
HCT: 31 % — ABNORMAL LOW (ref 39.0–52.0)
HCT: 30 % — ABNORMAL LOW (ref 39.0–52.0)
HCT: 33 % — ABNORMAL LOW (ref 39.0–52.0)
HCT: 33 % — ABNORMAL LOW (ref 39.0–52.0)
HCT: 34 % — ABNORMAL LOW (ref 39.0–52.0)
Hemoglobin: 12.6 g/dL — ABNORMAL LOW (ref 13.0–17.0)
Hemoglobin: 10.5 g/dL — ABNORMAL LOW (ref 13.0–17.0)
Hemoglobin: 10.2 g/dL — ABNORMAL LOW (ref 13.0–17.0)
Hemoglobin: 11.2 g/dL — ABNORMAL LOW (ref 13.0–17.0)
Hemoglobin: 11.2 g/dL — ABNORMAL LOW (ref 13.0–17.0)
Hemoglobin: 11.6 g/dL — ABNORMAL LOW (ref 13.0–17.0)
O2 Saturation: 100 %
O2 Saturation: 100 %
O2 Saturation: 100 %
O2 Saturation: 99 %
O2 Saturation: 99 %
O2 Saturation: 98 %
Patient temperature: 35.2
Patient temperature: 35.8
Patient temperature: 36.6
Potassium: 3.7 mmol/L (ref 3.5–5.1)
Potassium: 4.6 mmol/L (ref 3.5–5.1)
Potassium: 4.2 mmol/L (ref 3.5–5.1)
Potassium: 3.8 mmol/L (ref 3.5–5.1)
Potassium: 4.4 mmol/L (ref 3.5–5.1)
Potassium: 4.4 mmol/L (ref 3.5–5.1)
Sodium: 140 mmol/L (ref 135–145)
Sodium: 139 mmol/L (ref 135–145)
Sodium: 137 mmol/L (ref 135–145)
Sodium: 137 mmol/L (ref 135–145)
Sodium: 139 mmol/L (ref 135–145)
Sodium: 139 mmol/L (ref 135–145)
TCO2: 27 mmol/L (ref 22–32)
TCO2: 27 mmol/L (ref 22–32)
TCO2: 25 mmol/L (ref 22–32)
TCO2: 28 mmol/L (ref 22–32)
TCO2: 24 mmol/L (ref 22–32)
TCO2: 23 mmol/L (ref 22–32)
pCO2 arterial: 46.4 mmHg (ref 32–48)
pCO2 arterial: 41.6 mmHg (ref 32–48)
pCO2 arterial: 41.3 mmHg (ref 32–48)
pCO2 arterial: 45.3 mmHg (ref 32–48)
pCO2 arterial: 40.7 mmHg (ref 32–48)
pCO2 arterial: 39.9 mmHg (ref 32–48)
pH, Arterial: 7.351 (ref 7.35–7.45)
pH, Arterial: 7.396 (ref 7.35–7.45)
pH, Arterial: 7.371 (ref 7.35–7.45)
pH, Arterial: 7.369 (ref 7.35–7.45)
pH, Arterial: 7.34 — ABNORMAL LOW (ref 7.35–7.45)
pH, Arterial: 7.344 — ABNORMAL LOW (ref 7.35–7.45)
pO2, Arterial: 445 mmHg — ABNORMAL HIGH (ref 83–108)
pO2, Arterial: 406 mmHg — ABNORMAL HIGH (ref 83–108)
pO2, Arterial: 276 mmHg — ABNORMAL HIGH (ref 83–108)
pO2, Arterial: 116 mmHg — ABNORMAL HIGH (ref 83–108)
pO2, Arterial: 149 mmHg — ABNORMAL HIGH (ref 83–108)
pO2, Arterial: 112 mmHg — ABNORMAL HIGH (ref 83–108)

## 2021-06-09 LAB — GLUCOSE, CAPILLARY
Glucose-Capillary: 96 mg/dL (ref 70–99)
Glucose-Capillary: 86 mg/dL (ref 70–99)
Glucose-Capillary: 121 mg/dL — ABNORMAL HIGH (ref 70–99)
Glucose-Capillary: 113 mg/dL — ABNORMAL HIGH (ref 70–99)
Glucose-Capillary: 103 mg/dL — ABNORMAL HIGH (ref 70–99)
Glucose-Capillary: 114 mg/dL — ABNORMAL HIGH (ref 70–99)
Glucose-Capillary: 114 mg/dL — ABNORMAL HIGH (ref 70–99)
Glucose-Capillary: 115 mg/dL — ABNORMAL HIGH (ref 70–99)
Glucose-Capillary: 113 mg/dL — ABNORMAL HIGH (ref 70–99)

## 2021-06-09 LAB — POCT I-STAT EG7
Acid-base deficit: 1 mmol/L (ref 0.0–2.0)
Bicarbonate: 24.2 mmol/L (ref 20.0–28.0)
Calcium, Ion: 1.09 mmol/L — ABNORMAL LOW (ref 1.15–1.40)
HCT: 32 % — ABNORMAL LOW (ref 39.0–52.0)
Hemoglobin: 10.9 g/dL — ABNORMAL LOW (ref 13.0–17.0)
O2 Saturation: 79 %
Potassium: 4.6 mmol/L (ref 3.5–5.1)
Sodium: 140 mmol/L (ref 135–145)
TCO2: 25 mmol/L (ref 22–32)
pCO2, Ven: 40.5 mmHg — ABNORMAL LOW (ref 44–60)
pH, Ven: 7.383 (ref 7.25–7.43)
pO2, Ven: 44 mmHg (ref 32–45)

## 2021-06-09 LAB — CBC
HCT: 34.6 % — ABNORMAL LOW (ref 39.0–52.0)
HCT: 33 % — ABNORMAL LOW (ref 39.0–52.0)
Hemoglobin: 11.4 g/dL — ABNORMAL LOW (ref 13.0–17.0)
Hemoglobin: 11.2 g/dL — ABNORMAL LOW (ref 13.0–17.0)
MCH: 30.7 pg (ref 26.0–34.0)
MCH: 31.6 pg (ref 26.0–34.0)
MCHC: 32.9 g/dL (ref 30.0–36.0)
MCHC: 33.9 g/dL (ref 30.0–36.0)
MCV: 93.3 fL (ref 80.0–100.0)
MCV: 93.2 fL (ref 80.0–100.0)
Platelets: 172 10*3/uL (ref 150–400)
Platelets: 185 10*3/uL (ref 150–400)
RBC: 3.71 MIL/uL — ABNORMAL LOW (ref 4.22–5.81)
RBC: 3.54 MIL/uL — ABNORMAL LOW (ref 4.22–5.81)
RDW: 13.1 % (ref 11.5–15.5)
RDW: 13.1 % (ref 11.5–15.5)
WBC: 12.3 10*3/uL — ABNORMAL HIGH (ref 4.0–10.5)
WBC: 12 10*3/uL — ABNORMAL HIGH (ref 4.0–10.5)
nRBC: 0 % (ref 0.0–0.2)
nRBC: 0 % (ref 0.0–0.2)

## 2021-06-09 LAB — ECHO INTRAOPERATIVE TEE
Area-P 1/2: 3.21 cm2
Height: 68 in
S' Lateral: 3.5 cm
Weight: 3088 oz

## 2021-06-09 LAB — PROTIME-INR
INR: 1.3 — ABNORMAL HIGH (ref 0.8–1.2)
Prothrombin Time: 16.1 seconds — ABNORMAL HIGH (ref 11.4–15.2)

## 2021-06-09 LAB — ABO/RH: ABO/RH(D): A POS

## 2021-06-09 LAB — APTT: aPTT: 26 seconds (ref 24–36)

## 2021-06-09 LAB — MAGNESIUM: Magnesium: 2.7 mg/dL — ABNORMAL HIGH (ref 1.7–2.4)

## 2021-06-09 LAB — HEMOGLOBIN AND HEMATOCRIT, BLOOD
HCT: 28.4 % — ABNORMAL LOW (ref 39.0–52.0)
Hemoglobin: 9.7 g/dL — ABNORMAL LOW (ref 13.0–17.0)

## 2021-06-09 LAB — PLATELET COUNT: Platelets: 189 10*3/uL (ref 150–400)

## 2021-06-09 SURGERY — CORONARY ARTERY BYPASS GRAFTING (CABG)
Anesthesia: General | Site: Chest

## 2021-06-09 MED ORDER — SODIUM CHLORIDE 0.9% FLUSH: 3.0000 mL | INTRAVENOUS | Status: DC | PRN

## 2021-06-09 MED ORDER — PROTAMINE SULFATE 10 MG/ML IV SOLN
INTRAVENOUS | Status: AC
  Filled 2021-06-09: qty 5

## 2021-06-09 MED ORDER — SODIUM CHLORIDE (PF) 0.9 % IJ SOLN
OROMUCOSAL | Status: DC | PRN
  Administered 2021-06-09: 12 mL via TOPICAL

## 2021-06-09 MED ORDER — MIDAZOLAM HCL (PF) 5 MG/ML IJ SOLN
INTRAMUSCULAR | Status: DC | PRN
  Administered 2021-06-09: 3 mg via INTRAVENOUS
  Administered 2021-06-09: 1 mg via INTRAVENOUS
  Administered 2021-06-09 (×2): 2 mg via INTRAVENOUS
  Administered 2021-06-09 (×2): 1 mg via INTRAVENOUS

## 2021-06-09 MED ORDER — PHENYLEPHRINE HCL-NACL 20-0.9 MG/250ML-% IV SOLN
0.0000 ug/min | INTRAVENOUS | Status: DC
  Filled 2021-06-09: qty 250

## 2021-06-09 MED ORDER — METOPROLOL TARTRATE 12.5 MG HALF TABLET
ORAL_TABLET | ORAL | Status: AC
  Filled 2021-06-09: qty 1

## 2021-06-09 MED ORDER — HEMOSTATIC AGENTS (NO CHARGE) OPTIME
TOPICAL | Status: DC | PRN
  Administered 2021-06-09: 1 via TOPICAL

## 2021-06-09 MED ORDER — DEXMEDETOMIDINE HCL IN NACL 400 MCG/100ML IV SOLN
0.0000 ug/kg/h | INTRAVENOUS | Status: DC
  Filled 2021-06-09: qty 100

## 2021-06-09 MED ORDER — BISACODYL 10 MG RE SUPP: 10.0000 mg | Freq: Every day | RECTAL | Status: DC

## 2021-06-09 MED ORDER — LACTATED RINGERS IV SOLN: INTRAVENOUS | Status: DC | PRN

## 2021-06-09 MED ORDER — ROCURONIUM BROMIDE 10 MG/ML (PF) SYRINGE
PREFILLED_SYRINGE | INTRAVENOUS | Status: AC
  Filled 2021-06-09: qty 10

## 2021-06-09 MED ORDER — MIDAZOLAM HCL (PF) 10 MG/2ML IJ SOLN
INTRAMUSCULAR | Status: AC
  Filled 2021-06-09: qty 2

## 2021-06-09 MED ORDER — METOPROLOL TARTRATE 5 MG/5ML IV SOLN: 2.5000 mg | INTRAVENOUS | Status: DC | PRN

## 2021-06-09 MED ORDER — FENTANYL CITRATE (PF) 250 MCG/5ML IJ SOLN
INTRAMUSCULAR | Status: AC
  Filled 2021-06-09: qty 5

## 2021-06-09 MED ORDER — ALPRAZOLAM 0.5 MG PO TABS: 1.0000 mg | ORAL_TABLET | Freq: Every evening | ORAL | Status: DC | PRN

## 2021-06-09 MED ORDER — ROCURONIUM BROMIDE 10 MG/ML (PF) SYRINGE
PREFILLED_SYRINGE | INTRAVENOUS | Status: DC | PRN
  Administered 2021-06-09 (×5): 50 mg via INTRAVENOUS
  Administered 2021-06-09: 100 mg via INTRAVENOUS
  Administered 2021-06-09: 50 mg via INTRAVENOUS

## 2021-06-09 MED ORDER — ONDANSETRON HCL 4 MG/2ML IJ SOLN: 4.0000 mg | Freq: Four times a day (QID) | INTRAMUSCULAR | Status: DC | PRN

## 2021-06-09 MED ORDER — DOCUSATE SODIUM 100 MG PO CAPS
200.0000 mg | ORAL_CAPSULE | Freq: Every day | ORAL | Status: DC
  Administered 2021-06-10 – 2021-06-14 (×5): 200 mg via ORAL
  Filled 2021-06-09 (×6): qty 2

## 2021-06-09 MED ORDER — SODIUM CHLORIDE 0.9% FLUSH
3.0000 mL | Freq: Two times a day (BID) | INTRAVENOUS | Status: DC
  Administered 2021-06-10 – 2021-06-12 (×2): 3 mL via INTRAVENOUS

## 2021-06-09 MED ORDER — CHLORHEXIDINE GLUCONATE 4 % EX LIQD: 30.0000 mL | CUTANEOUS | Status: DC

## 2021-06-09 MED ORDER — BISACODYL 5 MG PO TBEC
10.0000 mg | DELAYED_RELEASE_TABLET | Freq: Every day | ORAL | Status: DC
  Administered 2021-06-10 – 2021-06-14 (×5): 10 mg via ORAL
  Filled 2021-06-09 (×5): qty 2

## 2021-06-09 MED ORDER — FENTANYL CITRATE (PF) 250 MCG/5ML IJ SOLN
INTRAMUSCULAR | Status: DC | PRN
  Administered 2021-06-09 (×2): 100 ug via INTRAVENOUS
  Administered 2021-06-09: 50 ug via INTRAVENOUS
  Administered 2021-06-09 (×5): 100 ug via INTRAVENOUS
  Administered 2021-06-09: 50 ug via INTRAVENOUS
  Administered 2021-06-09: 100 ug via INTRAVENOUS
  Administered 2021-06-09: 50 ug via INTRAVENOUS
  Administered 2021-06-09: 400 ug via INTRAVENOUS

## 2021-06-09 MED ORDER — MIDAZOLAM HCL 2 MG/2ML IJ SOLN: 2.0000 mg | INTRAMUSCULAR | Status: DC | PRN

## 2021-06-09 MED ORDER — PANTOPRAZOLE SODIUM 40 MG PO TBEC
40.0000 mg | DELAYED_RELEASE_TABLET | Freq: Every day | ORAL | Status: DC
  Administered 2021-06-11 – 2021-06-14 (×4): 40 mg via ORAL
  Filled 2021-06-09 (×4): qty 1

## 2021-06-09 MED ORDER — ARTIFICIAL TEARS OPHTHALMIC OINT
TOPICAL_OINTMENT | OPHTHALMIC | Status: DC | PRN
  Administered 2021-06-09: 1 via OPHTHALMIC

## 2021-06-09 MED ORDER — LACTATED RINGERS IV SOLN: INTRAVENOUS | Status: DC

## 2021-06-09 MED ORDER — FAMOTIDINE IN NACL 20-0.9 MG/50ML-% IV SOLN
20.0000 mg | Freq: Two times a day (BID) | INTRAVENOUS | Status: DC
  Administered 2021-06-09: 20 mg via INTRAVENOUS
  Filled 2021-06-09: qty 50

## 2021-06-09 MED ORDER — POTASSIUM CHLORIDE 10 MEQ/50ML IV SOLN
10.0000 meq | INTRAVENOUS | Status: AC
  Administered 2021-06-09 (×3): 10 meq via INTRAVENOUS

## 2021-06-09 MED ORDER — ACETAMINOPHEN 160 MG/5ML PO SOLN: 1000.0000 mg | Freq: Four times a day (QID) | ORAL | Status: DC

## 2021-06-09 MED ORDER — NITROGLYCERIN IN D5W 200-5 MCG/ML-% IV SOLN: 0.0000 ug/min | INTRAVENOUS | Status: DC

## 2021-06-09 MED ORDER — METOPROLOL TARTRATE 25 MG/10 ML ORAL SUSPENSION: 12.5000 mg | Freq: Two times a day (BID) | ORAL | Status: DC

## 2021-06-09 MED ORDER — ASPIRIN 81 MG PO CHEW: 324.0000 mg | CHEWABLE_TABLET | Freq: Every day | ORAL | Status: DC

## 2021-06-09 MED ORDER — PROPOFOL 10 MG/ML IV BOLUS
INTRAVENOUS | Status: AC
  Filled 2021-06-09: qty 20

## 2021-06-09 MED ORDER — TRAMADOL HCL 50 MG PO TABS
50.0000 mg | ORAL_TABLET | ORAL | Status: DC | PRN
  Administered 2021-06-09 – 2021-06-14 (×6): 100 mg via ORAL
  Filled 2021-06-09 (×6): qty 2

## 2021-06-09 MED ORDER — ACETAMINOPHEN 500 MG PO TABS
1000.0000 mg | ORAL_TABLET | Freq: Once | ORAL | Status: AC
  Administered 2021-06-09: 1000 mg via ORAL

## 2021-06-09 MED ORDER — OXYCODONE HCL 5 MG PO TABS
5.0000 mg | ORAL_TABLET | ORAL | Status: DC | PRN
  Administered 2021-06-09: 10 mg via ORAL
  Administered 2021-06-10: 5 mg via ORAL
  Administered 2021-06-11 – 2021-06-13 (×3): 10 mg via ORAL
  Filled 2021-06-09: qty 1
  Filled 2021-06-09 (×4): qty 2

## 2021-06-09 MED ORDER — ALBUMIN HUMAN 5 % IV SOLN
250.0000 mL | INTRAVENOUS | Status: AC | PRN
  Administered 2021-06-09 (×4): 12.5 g via INTRAVENOUS
  Filled 2021-06-09 (×2): qty 250

## 2021-06-09 MED ORDER — DEXTROSE 50 % IV SOLN: 0.0000 mL | INTRAVENOUS | Status: DC | PRN

## 2021-06-09 MED ORDER — 0.9 % SODIUM CHLORIDE (POUR BTL) OPTIME
TOPICAL | Status: DC | PRN
  Administered 2021-06-09: 5000 mL

## 2021-06-09 MED ORDER — PHENYLEPHRINE 40 MCG/ML (10ML) SYRINGE FOR IV PUSH (FOR BLOOD PRESSURE SUPPORT)
PREFILLED_SYRINGE | INTRAVENOUS | Status: DC | PRN
  Administered 2021-06-09 (×2): 80 ug via INTRAVENOUS

## 2021-06-09 MED ORDER — MAGNESIUM SULFATE 4 GM/100ML IV SOLN
4.0000 g | Freq: Once | INTRAVENOUS | Status: AC
  Administered 2021-06-09: 4 g via INTRAVENOUS
  Filled 2021-06-09: qty 100

## 2021-06-09 MED ORDER — SODIUM CHLORIDE 0.9 % IV SOLN: 250.0000 mL | INTRAVENOUS | Status: DC

## 2021-06-09 MED ORDER — SODIUM CHLORIDE 0.9% FLUSH
10.0000 mL | Freq: Two times a day (BID) | INTRAVENOUS | Status: DC
  Administered 2021-06-09 – 2021-06-14 (×7): 10 mL

## 2021-06-09 MED ORDER — ~~LOC~~ CARDIAC SURGERY, PATIENT & FAMILY EDUCATION
Freq: Once | Status: DC
  Filled 2021-06-09: qty 1

## 2021-06-09 MED ORDER — HEPARIN SODIUM (PORCINE) 1000 UNIT/ML IJ SOLN
INTRAMUSCULAR | Status: DC | PRN
  Administered 2021-06-09: 3000 [IU] via INTRAVENOUS
  Administered 2021-06-09: 2000 [IU] via INTRAVENOUS
  Administered 2021-06-09: 26000 [IU] via INTRAVENOUS

## 2021-06-09 MED ORDER — HEPARIN SODIUM (PORCINE) 1000 UNIT/ML IJ SOLN
INTRAMUSCULAR | Status: AC
  Filled 2021-06-09: qty 1

## 2021-06-09 MED ORDER — ARTIFICIAL TEARS OPHTHALMIC OINT
TOPICAL_OINTMENT | OPHTHALMIC | Status: AC
  Filled 2021-06-09: qty 3.5

## 2021-06-09 MED ORDER — KETOROLAC TROMETHAMINE 30 MG/ML IJ SOLN
30.0000 mg | Freq: Four times a day (QID) | INTRAMUSCULAR | Status: AC
Start: 2021-06-09 — End: 2021-06-09
  Administered 2021-06-09 (×2): 30 mg via INTRAVENOUS
  Filled 2021-06-09 (×2): qty 1

## 2021-06-09 MED ORDER — PROTAMINE SULFATE 10 MG/ML IV SOLN
INTRAVENOUS | Status: AC
  Filled 2021-06-09: qty 25

## 2021-06-09 MED ORDER — ACETAMINOPHEN 160 MG/5ML PO SOLN
650.0000 mg | Freq: Once | ORAL | Status: AC
  Administered 2021-06-09: 650 mg
  Filled 2021-06-09: qty 20.3

## 2021-06-09 MED ORDER — LACTATED RINGERS IV SOLN
500.0000 mL | Freq: Once | INTRAVENOUS | Status: AC | PRN
  Administered 2021-06-10: 500 mL via INTRAVENOUS

## 2021-06-09 MED ORDER — CHLORHEXIDINE GLUCONATE CLOTH 2 % EX PADS
6.0000 | MEDICATED_PAD | Freq: Every day | CUTANEOUS | Status: DC
  Administered 2021-06-09 – 2021-06-11 (×3): 6 via TOPICAL

## 2021-06-09 MED ORDER — ADULT MULTIVITAMIN W/MINERALS CH
1.0000 | ORAL_TABLET | Freq: Every day | ORAL | Status: DC
  Administered 2021-06-10 – 2021-06-14 (×5): 1 via ORAL
  Filled 2021-06-09 (×5): qty 1

## 2021-06-09 MED ORDER — SODIUM CHLORIDE 0.45 % IV SOLN: INTRAVENOUS | Status: DC | PRN

## 2021-06-09 MED ORDER — METOPROLOL TARTRATE 12.5 MG HALF TABLET: 12.5000 mg | ORAL_TABLET | Freq: Once | ORAL | Status: DC

## 2021-06-09 MED ORDER — ASPIRIN EC 325 MG PO TBEC
325.0000 mg | DELAYED_RELEASE_TABLET | Freq: Every day | ORAL | Status: DC
  Administered 2021-06-10 – 2021-06-14 (×5): 325 mg via ORAL
  Filled 2021-06-09 (×5): qty 1

## 2021-06-09 MED ORDER — PROTAMINE SULFATE 10 MG/ML IV SOLN
INTRAVENOUS | Status: DC | PRN
  Administered 2021-06-09: 310 mg via INTRAVENOUS

## 2021-06-09 MED ORDER — ROSUVASTATIN CALCIUM 20 MG PO TABS
40.0000 mg | ORAL_TABLET | Freq: Every day | ORAL | Status: DC
  Administered 2021-06-10 – 2021-06-14 (×5): 40 mg via ORAL
  Filled 2021-06-09 (×6): qty 2

## 2021-06-09 MED ORDER — CHLORHEXIDINE GLUCONATE 0.12 % MT SOLN
15.0000 mL | OROMUCOSAL | Status: AC
  Administered 2021-06-09: 15 mL via OROMUCOSAL

## 2021-06-09 MED ORDER — ALBUMIN HUMAN 5 % IV SOLN: INTRAVENOUS | Status: DC | PRN

## 2021-06-09 MED ORDER — INSULIN ASPART 100 UNIT/ML IJ SOLN: 0.0000 [IU] | INTRAMUSCULAR | Status: DC

## 2021-06-09 MED ORDER — METOCLOPRAMIDE HCL 5 MG/ML IJ SOLN
10.0000 mg | Freq: Four times a day (QID) | INTRAMUSCULAR | Status: AC
  Administered 2021-06-09 – 2021-06-10 (×5): 10 mg via INTRAVENOUS
  Filled 2021-06-09 (×5): qty 2

## 2021-06-09 MED ORDER — SURGIFLO WITH THROMBIN (HEMOSTATIC MATRIX KIT) OPTIME
TOPICAL | Status: DC | PRN
  Administered 2021-06-09: 1 via TOPICAL

## 2021-06-09 MED ORDER — LACTATED RINGERS IV SOLN
INTRAVENOUS | Status: DC | PRN
Start: 2021-06-09 — End: 2021-06-09

## 2021-06-09 MED ORDER — CHLORHEXIDINE GLUCONATE 0.12 % MT SOLN
15.0000 mL | Freq: Once | OROMUCOSAL | Status: AC
  Administered 2021-06-09: 15 mL via OROMUCOSAL

## 2021-06-09 MED ORDER — PHENYLEPHRINE 40 MCG/ML (10ML) SYRINGE FOR IV PUSH (FOR BLOOD PRESSURE SUPPORT)
PREFILLED_SYRINGE | INTRAVENOUS | Status: AC
  Filled 2021-06-09: qty 10

## 2021-06-09 MED ORDER — ACETAMINOPHEN 500 MG PO TABS
1000.0000 mg | ORAL_TABLET | Freq: Four times a day (QID) | ORAL | Status: DC
  Administered 2021-06-09 – 2021-06-14 (×17): 1000 mg via ORAL
  Filled 2021-06-09 (×17): qty 2

## 2021-06-09 MED ORDER — METOPROLOL TARTRATE 12.5 MG HALF TABLET
12.5000 mg | ORAL_TABLET | Freq: Two times a day (BID) | ORAL | Status: DC
  Administered 2021-06-09 – 2021-06-14 (×8): 12.5 mg via ORAL
  Filled 2021-06-09 (×8): qty 1

## 2021-06-09 MED ORDER — ACETAMINOPHEN 650 MG RE SUPP: 650.0000 mg | Freq: Once | RECTAL | Status: AC

## 2021-06-09 MED ORDER — SODIUM CHLORIDE 0.9% FLUSH: 10.0000 mL | INTRAVENOUS | Status: DC | PRN

## 2021-06-09 MED ORDER — VANCOMYCIN HCL IN DEXTROSE 1-5 GM/200ML-% IV SOLN
1000.0000 mg | Freq: Once | INTRAVENOUS | Status: AC
  Administered 2021-06-09: 1000 mg via INTRAVENOUS
  Filled 2021-06-09: qty 200

## 2021-06-09 MED ORDER — FENTANYL CITRATE PF 50 MCG/ML IJ SOSY
50.0000 ug | PREFILLED_SYRINGE | INTRAMUSCULAR | Status: DC | PRN
  Administered 2021-06-09: 50 ug via INTRAVENOUS
  Filled 2021-06-09: qty 1

## 2021-06-09 MED ORDER — PROPOFOL 10 MG/ML IV BOLUS
INTRAVENOUS | Status: DC | PRN
Start: 2021-06-09 — End: 2021-06-09
  Administered 2021-06-09: 50 mg via INTRAVENOUS

## 2021-06-09 MED ORDER — CEFAZOLIN SODIUM-DEXTROSE 2-4 GM/100ML-% IV SOLN
2.0000 g | Freq: Three times a day (TID) | INTRAVENOUS | Status: AC
  Administered 2021-06-09 – 2021-06-11 (×6): 2 g via INTRAVENOUS
  Filled 2021-06-09 (×6): qty 100

## 2021-06-09 MED ORDER — INSULIN REGULAR(HUMAN) IN NACL 100-0.9 UT/100ML-% IV SOLN: INTRAVENOUS | Status: DC

## 2021-06-09 MED ORDER — LIDOCAINE 2% (20 MG/ML) 5 ML SYRINGE
INTRAMUSCULAR | Status: AC
  Filled 2021-06-09: qty 5

## 2021-06-09 MED ORDER — PLASMA-LYTE A IV SOLN
INTRAVENOUS | Status: DC | PRN
  Administered 2021-06-09: 500 mL via INTRAVASCULAR

## 2021-06-09 MED ORDER — SODIUM CHLORIDE 0.9 % IV SOLN: INTRAVENOUS | Status: DC

## 2021-06-09 SURGICAL SUPPLY — 101 items
ADAPTER CARDIO PERF ANTE/RETRO (ADAPTER) ×3 IMPLANT
BAG DECANTER FOR FLEXI CONT (MISCELLANEOUS) ×3 IMPLANT
BLADE CLIPPER SURG (BLADE) ×3 IMPLANT
BLADE STERNUM SYSTEM 6 (BLADE) ×3 IMPLANT
BLADE SURG 11 STRL SS (BLADE) ×1 IMPLANT
BLADE SURG 12 STRL SS (BLADE) ×3 IMPLANT
BNDG ELASTIC 4X5.8 VLCR STR LF (GAUZE/BANDAGES/DRESSINGS) ×3 IMPLANT
BNDG ELASTIC 6X5.8 VLCR STR LF (GAUZE/BANDAGES/DRESSINGS) ×3 IMPLANT
BNDG GAUZE ELAST 4 BULKY (GAUZE/BANDAGES/DRESSINGS) ×3 IMPLANT
CANISTER SUCT 3000ML PPV (MISCELLANEOUS) ×3 IMPLANT
CANNULA AORTIC ROOT 9FR (CANNULA) IMPLANT
CANNULA GUNDRY RCSP 15FR (MISCELLANEOUS) ×3 IMPLANT
CATH CPB KIT VANTRIGT (MISCELLANEOUS) ×3 IMPLANT
CATH ROBINSON RED A/P 18FR (CATHETERS) ×9 IMPLANT
CATH THORACIC 28FR RT ANG (CATHETERS) ×3 IMPLANT
CLIP TI WIDE RED SMALL 24 (CLIP) ×1 IMPLANT
CONTAINER PROTECT SURGISLUSH (MISCELLANEOUS) ×6 IMPLANT
DERMABOND ADVANCED (GAUZE/BANDAGES/DRESSINGS) ×1
DERMABOND ADVANCED .7 DNX12 (GAUZE/BANDAGES/DRESSINGS) IMPLANT
DRAIN CHANNEL 32F RND 10.7 FF (WOUND CARE) ×3 IMPLANT
DRAPE CARDIOVASCULAR INCISE (DRAPES) ×1
DRAPE SLUSH/WARMER DISC (DRAPES) ×3 IMPLANT
DRAPE SRG 135X102X78XABS (DRAPES) ×2 IMPLANT
DRSG AQUACEL AG ADV 3.5X14 (GAUZE/BANDAGES/DRESSINGS) ×3 IMPLANT
ELECT BLADE 4.0 EZ CLEAN MEGAD (MISCELLANEOUS) ×3
ELECT BLADE 6.5 EXT (BLADE) ×3 IMPLANT
ELECT CAUTERY BLADE 6.4 (BLADE) ×3 IMPLANT
ELECT REM PT RETURN 9FT ADLT (ELECTROSURGICAL) ×6
ELECTRODE BLDE 4.0 EZ CLN MEGD (MISCELLANEOUS) ×2 IMPLANT
ELECTRODE REM PT RTRN 9FT ADLT (ELECTROSURGICAL) ×4 IMPLANT
FELT TEFLON 1X6 (MISCELLANEOUS) ×6 IMPLANT
GAUZE 4X4 16PLY ~~LOC~~+RFID DBL (SPONGE) ×3 IMPLANT
GAUZE SPONGE 4X4 12PLY STRL (GAUZE/BANDAGES/DRESSINGS) ×6 IMPLANT
GLOVE SURG ENC MOIS LTX SZ7.5 (GLOVE) ×9 IMPLANT
GOWN STRL REUS W/ TWL LRG LVL3 (GOWN DISPOSABLE) ×8 IMPLANT
GOWN STRL REUS W/TWL LRG LVL3 (GOWN DISPOSABLE) ×4
HEMOSTAT POWDER SURGIFOAM 1G (HEMOSTASIS) ×9 IMPLANT
HEMOSTAT SURGICEL 2X14 (HEMOSTASIS) ×3 IMPLANT
INSERT FOGARTY XLG (MISCELLANEOUS) IMPLANT
KIT BASIN OR (CUSTOM PROCEDURE TRAY) ×3 IMPLANT
KIT SUCTION CATH 14FR (SUCTIONS) ×3 IMPLANT
KIT TURNOVER KIT B (KITS) ×3 IMPLANT
KIT VASOVIEW HEMOPRO 2 VH 4000 (KITS) ×3 IMPLANT
LEAD PACING MYOCARDI (MISCELLANEOUS) ×3 IMPLANT
MARKER GRAFT CORONARY BYPASS (MISCELLANEOUS) ×9 IMPLANT
NS IRRIG 1000ML POUR BTL (IV SOLUTION) ×15 IMPLANT
PACK ACCESSORY CANNULA KIT (KITS) IMPLANT
PACK E OPEN HEART (SUTURE) ×3 IMPLANT
PACK OPEN HEART (CUSTOM PROCEDURE TRAY) ×3 IMPLANT
PAD ARMBOARD 7.5X6 YLW CONV (MISCELLANEOUS) ×6 IMPLANT
PAD ELECT DEFIB RADIOL ZOLL (MISCELLANEOUS) ×3 IMPLANT
PENCIL BUTTON HOLSTER BLD 10FT (ELECTRODE) ×3 IMPLANT
POSITIONER HEAD DONUT 9IN (MISCELLANEOUS) ×3 IMPLANT
POWDER SURGICEL 3.0 GRAM (HEMOSTASIS) ×3 IMPLANT
PUNCH AORTIC ROTATE 4.0MM (MISCELLANEOUS) IMPLANT
PUNCH AORTIC ROTATE 4.5MM 8IN (MISCELLANEOUS) ×1 IMPLANT
PUNCH AORTIC ROTATE 5MM 8IN (MISCELLANEOUS) IMPLANT
SET MPS 3-ND DEL (MISCELLANEOUS) ×1 IMPLANT
SPONGE T-LAP 18X18 ~~LOC~~+RFID (SPONGE) ×12 IMPLANT
SPONGE T-LAP 4X18 ~~LOC~~+RFID (SPONGE) ×3 IMPLANT
SUPPORT HEART JANKE-BARRON (MISCELLANEOUS) ×3 IMPLANT
SURGIFLO W/THROMBIN 8M KIT (HEMOSTASIS) ×3 IMPLANT
SUT BONE WAX W31G (SUTURE) ×3 IMPLANT
SUT ETHIBOND 2 0 SH (SUTURE) ×1
SUT ETHIBOND 2 0 SH 36X2 (SUTURE) IMPLANT
SUT MNCRL AB 4-0 PS2 18 (SUTURE) ×1 IMPLANT
SUT PROLENE 3 0 SH DA (SUTURE) IMPLANT
SUT PROLENE 3 0 SH1 36 (SUTURE) IMPLANT
SUT PROLENE 4 0 RB 1 (SUTURE) ×4
SUT PROLENE 4 0 SH DA (SUTURE) ×3 IMPLANT
SUT PROLENE 4-0 RB1 .5 CRCL 36 (SUTURE) ×2 IMPLANT
SUT PROLENE 5 0 C 1 36 (SUTURE) IMPLANT
SUT PROLENE 6 0 C 1 30 (SUTURE) ×2 IMPLANT
SUT PROLENE 6 0 CC (SUTURE) ×11 IMPLANT
SUT PROLENE 8 0 BV175 6 (SUTURE) IMPLANT
SUT PROLENE BLUE 7 0 (SUTURE) ×3 IMPLANT
SUT SILK  1 MH (SUTURE)
SUT SILK 1 MH (SUTURE) IMPLANT
SUT SILK 2 0 SH CR/8 (SUTURE) ×1 IMPLANT
SUT SILK 3 0 SH CR/8 (SUTURE) IMPLANT
SUT STEEL 6MS V (SUTURE) ×6 IMPLANT
SUT STEEL STERNAL CCS#1 18IN (SUTURE) ×1 IMPLANT
SUT STEEL SZ 6 DBL 3X14 BALL (SUTURE) ×2 IMPLANT
SUT VIC AB 1 CTX 36 (SUTURE) ×2
SUT VIC AB 1 CTX36XBRD ANBCTR (SUTURE) ×4 IMPLANT
SUT VIC AB 2-0 CT1 27 (SUTURE)
SUT VIC AB 2-0 CT1 36 (SUTURE) ×1 IMPLANT
SUT VIC AB 2-0 CT1 TAPERPNT 27 (SUTURE) IMPLANT
SUT VIC AB 2-0 CTX 27 (SUTURE) IMPLANT
SUT VIC AB 3-0 X1 27 (SUTURE) IMPLANT
SYSTEM SAHARA CHEST DRAIN ATS (WOUND CARE) ×3 IMPLANT
TAPE CLOTH SOFT 2X10 (GAUZE/BANDAGES/DRESSINGS) ×1 IMPLANT
TAPE CLOTH SURG 4X10 WHT LF (GAUZE/BANDAGES/DRESSINGS) ×2 IMPLANT
TOWEL GREEN STERILE (TOWEL DISPOSABLE) ×3 IMPLANT
TOWEL GREEN STERILE FF (TOWEL DISPOSABLE) ×3 IMPLANT
TRAY FOLEY SLVR 16FR TEMP STAT (SET/KITS/TRAYS/PACK) ×3 IMPLANT
TUBE CONNECTING 20X1/4 (TUBING) ×1 IMPLANT
TUBING LAP HI FLOW INSUFFLATIO (TUBING) ×3 IMPLANT
UNDERPAD 30X36 HEAVY ABSORB (UNDERPADS AND DIAPERS) ×3 IMPLANT
WATER STERILE IRR 1000ML POUR (IV SOLUTION) ×6 IMPLANT
YANKAUER SUCT BULB TIP NO VENT (SUCTIONS) ×1 IMPLANT

## 2021-06-09 NOTE — H&P (Signed)
PCP is Bubba Camp, [place] ?Referring Provider is No ref. provider found ? ?No chief complaint on file. ? ?Patient examined, images of coronary angiogram and echocardiogram and recent chest x-ray personally reviewed and counseled with patient. ?HPI: ?56 year old non-smoker with hyperlipidemia was evaluated and found to have a high risk calcium score cardiac CT scan.  He was subsequently followed up in the cardiology office for chest pain with exertion.  He was placed on Imdur, Crestor, and aspirin.  He was scheduled for cardiac catheterization. ? ?This was performed on March 8.  He has a high-grade proximal LAD stenosis and a high-grade stenosis of a OM 2 branch of the dominant circumflex.  The RCA is small and and nondominant and is filling via collaterals. ? ?Echocardiogram shows normal LV systolic function.  No significant valvular disease. ? ?Precath chest x-ray shows clear lung fields ? ?Since the heart cath the patient has had some intermittent chest pain insomnia anxiety and financial concern over being held out of work.  He has been compliant with his medications including aspirin, Imdur, Crestor.  He is not placed on a beta-blocker. ? ?Past Medical History:  ?Diagnosis Date  ? Coronary artery disease   ? High blood pressure   ? High cholesterol   ? ? ?Past Surgical History:  ?Procedure Laterality Date  ? LEFT HEART CATH AND CORONARY ANGIOGRAPHY N/A 05/07/2021  ? Procedure: LEFT HEART CATH AND CORONARY ANGIOGRAPHY;  Surgeon: Wellington Hampshire, MD;  Location: [facility];  Service: Cardiovascular;  Laterality: N/A;  ? ? ?History reviewed. No pertinent family history. ? ?Social History ?Social History  ? ?Tobacco Use  ? Smoking status: Never  ? Smokeless tobacco: Never  ?Vaping Use  ? Vaping Use: Never used  ?Substance Use Topics  ? Alcohol use: Yes  ? Drug use: Never  ? ? ?Current Facility-Administered Medications  ?Medication Dose Route Frequency Provider Last Rate Last Admin  ? ceFAZolin (ANCEF)  IVPB 2g/100 mL premix  2 g Intravenous To OR Dahlia Byes, MD      ? ceFAZolin (ANCEF) IVPB 2g/100 mL premix  2 g Intravenous To OR Dahlia Byes, MD      ? chlorhexidine (HIBICLENS) 4 % liquid 2 application.  30 mL Topical UD Dahlia Byes, MD      ? [facility] Cardiac Surgery, Patient & Family Education   Does not apply Once Dahlia Byes, MD      ? dexmedetomidine (PRECEDEX) 400 MCG/100ML (4 mcg/mL) infusion  0.1-0.7 mcg/kg/hr Intravenous To OR Dahlia Byes, MD      ? EPINEPHrine (ADRENALIN) 5 mg in NS 250 mL (0.02 mg/mL) premix infusion  0-10 mcg/min Intravenous To OR Dahlia Byes, MD      ? heparin 30,000 units/NS 1000 mL solution for CELLSAVER   Other To OR Dahlia Byes, MD      ? heparin sodium (porcine) 2,500 Units, papaverine 30 mg in electrolyte-A (PLASMALYTE-A PH 7.4) 500 mL irrigation   Irrigation To OR Dahlia Byes, MD      ? insulin regular, human (MYXREDLIN) 100 units/ 100 mL infusion   Intravenous To OR Dahlia Byes, MD      ? magnesium sulfate (IV Push/IM) injection 40 mEq  40 mEq Other To OR Dahlia Byes, MD      ? metoprolol tartrate (LOPRESSOR) tablet 12.5 mg  12.5 mg Oral Once Dahlia Byes, MD      ? milrinone (PRIMACOR) 20 MG/100 ML (0.2 mg/mL) infusion  0.3 mcg/kg/min Intravenous To OR Kaamil Morefield,  Collier Salina, MD      ? nitroGLYCERIN 50 mg in dextrose 5 % 250 mL (0.2 mg/mL) infusion  2-200 mcg/min Intravenous To OR Dahlia Byes, MD      ? norepinephrine (LEVOPHED) 4mg  in 241mL (0.016 mg/mL) premix infusion  0-40 mcg/min Intravenous To OR Dahlia Byes, MD      ? phenylephrine (NEO-SYNEPHRINE) 20mg /NS 229mL premix infusion  30-200 mcg/min Intravenous To OR Dahlia Byes, MD      ? potassium chloride injection 80 mEq  80 mEq Other To OR Dahlia Byes, MD      ? tranexamic acid (CYKLOKAPRON) 2,500 mg in sodium chloride 0.9 % 250 mL (10 mg/mL) infusion  1.5 mg/kg/hr Intravenous To OR Dahlia Byes, MD      ? tranexamic acid (CYKLOKAPRON) bolus via infusion - over  30 minutes 1,342.5 mg  15 mg/kg Intravenous To OR Dahlia Byes, MD      ? tranexamic acid (CYKLOKAPRON) pump prime solution 179 mg  2 mg/kg Intracatheter To OR Dahlia Byes, MD      ? vancomycin (VANCOREADY) IVPB 1500 mg/300 mL  1,500 mg Intravenous To OR Dahlia Byes, MD      ? ?Facility-Administered Medications Ordered in Other Encounters  ?Medication Dose Route Frequency Provider Last Rate Last Admin  ? fentaNYL citrate (PF) (SUBLIMAZE) injection   Intravenous Anesthesia Intra-op Carolan Clines, CRNA   50 mcg at 06/09/21 [id number]  ? lactated ringers infusion   Intravenous Continuous PRN Carolan Clines, CRNA   New Bag at 06/09/21 [phone]  ? midazolam PF (VERSED) injection   Intravenous Anesthesia Intra-op Carolan Clines, CRNA   1 mg at 06/09/21 [id number]  ? ? ?Allergies  ?Allergen Reactions  ? Morphine   ?  Pt states he doesn't want morphine, no reaction documented   ? ? ?Review of Systems: ?     ? ?           ? ?Review of Systems :  [ y ] = yes, [  ] = no ? ?      General :  Weight gain [   ]    Weight loss  [   ]  Fatigue [  ]  Fever [  ]  Chills  [  ]                                 ? ? ?       HEENT    Headache [  ]  Dizziness [  ]  Blurred vision [  ] Glaucoma  [  ]   ?                       Nosebleeds [  ] Painful or loose teeth [  ] ? ?      Cardiac :  Chest pain/ pressure [ y ]  Resting SOB [  ] exertional SOB [  ] ?                       Orthopnea [  ]  Pedal edema  [  ]  Palpitations [  ] Syncope/presyncope [ ]  ?                       Paroxysmal nocturnal dyspnea [  ] ? ?       Pulmonary : cough [  ]  wheezing [  ]  Hemoptysis [  ] Sputum [  ] Snoring [  ] ?                             Pneumothorax [  ]  Sleep apnea [  ] ? ?      GI : Vomiting [  ]  Dysphagia [  ]  Melena  [  ]  Abdominal pain [  ] BRBPR [  ] ?             Heart burn [  ]  Constipation [  ] Diarrhea  [  ] Colonoscopy [   ] ? ?      GU : Hematuria [  ]  Dysuria [  ]  Nocturia [  ] UTI's [  ] ? ?      Vascular : Claudication  [  ]  Rest pain [  ]  DVT [  ] Vein stripping [  ] leg ulcers [  ] ?                         TIA [  ] Stroke [  ]  Varicose veins [  ] ? ?      NEURO :  Headaches  [  ] Seizures [  ] Vision changes [  ] Paresthesias [  ]                                        ? ?      Musculoskeletal :  Arthritis [  ] Gout  [  ]  Back pain [  ]  Joint pain [  ] ? ?      Skin :  Rash [  ]  Melanoma [  ] Sores [  ] ? ?      Heme : Bleeding problems [  ]Clotting Disorders [  ] Anemia [  ]Blood Transfusion [ ]  ? ?      Endocrine : Diabetes [  ] Heat or Cold intolerance [  ] Polyuria [  ]excessive thirst [ ]  ? ?      Psych : Depression [  ]  Anxiety [  y]  Psych hospitalizations [  ] Memory change [  ] ?     Insomnia- Y ?The patient went on a keto diet in 2022 and lost almost 80 pounds. ?       ?                   ?         ? ?                   ?         ? ? ?BP 124/75   Pulse 62   Temp 98.3 ?F (36.8 ?C) (Oral)   Resp 10   Ht 5\' 8"  (1.727 m)   Wt 87.5 kg   SpO2 99%   BMI 29.35 kg/m?  ?Physical Exam: ?    ?Physical Exam ? ?General: Middle-aged well-nourished Hispanic male anxious but no acute distress accompanied by his wife ?HEENT: Normocephalic pupils equal , dentition adequate ?Neck: Supple without JVD, adenopathy, or bruit ?Chest: Clear to auscultation, symmetrical breath sounds, no rhonchi, no  tenderness ?            or deformity ?Cardiovascular: Regular rate and rhythm, no murmur, no gallop, peripheral pulses ?            palpable in all extremities ?Abdomen:  Soft, nontender, no palpable mass or organomegaly ?Extremities: Warm, well-perfused, no clubbing cyanosis edema or tenderness, ?             no venous stasis changes of the legs ?Rectal/GU: Deferred ?Neuro: Grossly non--focal and symmetrical throughout ?Skin: Clean and dry without rash or ulceration ? ? ?Diagnostic Tests: ?Cardiac cath images personally reviewed showing significant three-vessel coronary disease with preserved LV function as noted  above ? ?Impression: ?Exertional angina ?Three-vessel coronary disease ?Hypertension ?Hyperlipidemia ? ? ?Plan: ?Patient be scheduled for multivessel CABG on Monday, April 10 at [facility].  Grafts will be planned to the LAD, O

## 2021-06-09 NOTE — Anesthesia Procedure Notes (Signed)
Arterial Line Insertion ?Start/End4/11/2021 6:55 AM, 06/09/2021 7:00 AM ?Performed by: Gaynelle Adu, MD, Garfield Cornea, CRNA, CRNA ? Patient location: Pre-op. ?Preanesthetic checklist: patient identified, IV checked, site marked, risks and benefits discussed, surgical consent, monitors and equipment checked, pre-op evaluation, timeout performed and anesthesia consent ?Lidocaine 1% used for infiltration and patient sedated ?Left, radial was placed ?Catheter size: 20 G ?Hand hygiene performed  and maximum sterile barriers used  ? ?Attempts: 2 ?Procedure performed without using ultrasound guided technique. ?Following insertion, dressing applied and Biopatch. ?Post procedure assessment: normal and unchanged ? ?Patient tolerated the procedure well with no immediate complications. ? ? ?

## 2021-06-09 NOTE — Discharge Summary (Addendum)
Physician Discharge Summary  ? ? ?   ?[address] ?      [place] [address] ?            [phone]   ? ?Patient ID: ?Nathan Hammond ?MRN: [id number] ?DOB/AGE: 1966/01/20 57 y.o. ? ?Admit date: 06/09/2021 ?Discharge date: 06/14/2021 ? ?Admission Diagnoses: ?Coronary artery disease ?Discharge Diagnoses:  ?S/p CABG x 3 ?Expected post op blood loss anemia ?History of hypertension ?History of high cholesterol ? ? ?Consults: None ? ?Procedure (s):  ?1.  Coronary artery bypass grafting x3 (left internal mammary artery to LAD, saphenous vein graft to distal circumflex, saphenous vein graft to RCA).   ?2.  Endoscopic harvest of right leg greater saphenous vein by Dr. Prescott Gum on 06/09/2021. ? ?HPI: ?This is a 56 year old non-smoker with hyperlipidemia was evaluated and found to have a high risk calcium score cardiac CT scan.  He was subsequently followed up in the cardiology office for chest pain with exertion.  He was placed on Imdur, Crestor, and aspirin.  He was scheduled for cardiac catheterization. ?  ?This was performed on March 8.  He has a high-grade proximal LAD stenosis and a high-grade stenosis of a OM 2 branch of the dominant circumflex.  The RCA is small and and nondominant and is filling via collaterals. ?  ?Echocardiogram shows normal LV systolic function.  No significant valvular disease. ?  ?Precath chest x-ray shows clear lung fields ?  ?Since the heart cath the patient has had some intermittent chest pain insomnia anxiety and financial concern over being held out of work.  He has been compliant with his medications including aspirin, Imdur, Crestor. ? ?Hospital Course: ?As dictated by Jadene Pierini PA-C: ?Patient underwent a CABG x 3. He was transferred from the OR to [facility] ICU in stable condition. ? ?Postoperative hospital course: ? ?The patient is overall doing well.  He was extubated using standard post cardiac surgical protocols without difficulty.  He has maintained stable hemodynamics.   Maintain normal sinus rhythm.  He does show evidence of postoperative volume overload but is responding well to diuretics.  Renal function has remained within normal limits.  Oxygen has been weaned and he maintains good saturations on room air.  He does have a mild expected postoperative acute blood loss anemia which is stable. ? ?Addendum: ?Patient was surgically stable for transfer from the ICU to 4E for further convalescence on 04/13. He is maintaining sinus rhythm. Epicardial pacing wires and chest tubes (output decreased) were removed on 04/14. His pre op HGA1C was 5.3 and he has no history of diabetes. Accu checks and SS PRN were stopped. He has been tolerating a diet. He was given a laxative to assist with bowel movement. He had a large bowel movement on 04/14. He has been ambulating on room air with good oxygenation. Sternal and RLE wounds are clean, dry, and healing without signs of infection. He continues to maintain sinus rhythm. Chest tube sutures will be removed in the office after discharge. Patient requesting a rolling walker, which has been ordered. As discussed with surgeon, patient  is felt surgically stable for discharge.  ?  ? ? ?Latest Vital Signs: ?Blood pressure 108/61, pulse 78, temperature 98.6 ?F (37 ?C), temperature source Oral, resp. rate 20, height 5\' 8"  (1.727 m), weight 89.1 kg, SpO2 97 %. ? ?Physical Exam: ?Cardiovascular: RRR ?Pulmonary: Diminished throughout but clear ?Abdomen: Soft, non tender, bowel sounds present. ?Extremities: Mild bilateral lower extremity edema. ?Wounds: Clean and  dry.  No erythema or signs of infection. ? ?Discharge Condition:Stable and discharged to home. ? ?Recent laboratory studies:  ?Lab Results  ?Component Value Date  ? WBC 12.8 (H) 06/12/2021  ? HGB 10.4 (L) 06/12/2021  ? HCT 31.0 (L) 06/12/2021  ? MCV 93.7 06/12/2021  ? PLT 192 06/12/2021  ? ?Lab Results  ?Component Value Date  ? NA 139 06/12/2021  ? K 4.2 06/12/2021  ? CL 108 06/12/2021  ? CO2 25  06/12/2021  ? CREATININE 0.98 06/12/2021  ? GLUCOSE 101 (H) 06/12/2021  ? ? ?  ?Diagnostic Studies: DG Chest 2 View ? ?Result Date: 06/14/2021 ?CLINICAL DATA:  Follow-up pneumothorax. EXAM: CHEST - 2 VIEW COMPARISON:  06/12/2021 FINDINGS: Interval removal of the left-sided chest tube. No signs of recurrent pneumothorax. Unchanged small right pleural effusion. Atelectasis is noted within both lung bases. IMPRESSION: 1. Left chest tube removal. No recurrent pneumothorax. 2. Unchanged small right pleural effusion and bibasilar atelectasis. Electronically Signed   By: Kerby Moors M.D.   On: 06/14/2021 09:19  ? ?DG Chest 2 View ? ?Result Date: 06/12/2021 ?CLINICAL DATA:  Coronary artery disease. EXAM: CHEST - 2 VIEW COMPARISON:  AP chest 06/11/2021 FINDINGS: Status post median sternotomy and CABG. Cardiac silhouette is again mildly enlarged. Mediastinal contours are within normal limits. There are again moderately decreased lung volumes. Left chest tube unchanged in position. No pneumothorax is identified. Interval removal of right internal jugular catheter sheath. Telemetry leads again overlie the chest. IMPRESSION:: IMPRESSION: 1. Interval removal of right internal jugular catheter sheath. 2. Chest tube remains unchanged in position. No pneumothorax is seen. Electronically Signed   By: Yvonne Kendall M.D.   On: 06/12/2021 07:56  ? ?DG Chest 2 View ? ?Result Date: 06/06/2021 ?CLINICAL DATA:  56 year old male with preoperative chest x-ray EXAM: CHEST - 2 VIEW COMPARISON:  04/22/2021 FINDINGS: Cardiomediastinal silhouette unchanged in size and contour. No evidence of central vascular congestion. No interlobular septal thickening. No pneumothorax or pleural effusion. Coarsened interstitial markings, with no confluent airspace disease. No acute displaced fracture. Degenerative changes of the spine. IMPRESSION: Negative for acute cardiopulmonary disease Electronically Signed   By: Corrie Mckusick D.O.   On: 06/06/2021 15:20   ? ?DG Chest Port 1 View ? ?Result Date: 06/11/2021 ?CLINICAL DATA:  Status post CABG. EXAM: PORTABLE CHEST 1 VIEW COMPARISON:  06/10/2021 FINDINGS: Low volume film. The cardio pericardial silhouette is enlarged. Left chest tube remains in place without evidence for left-sided pneumothorax. There is bibasilar atelectasis, similar to prior. Pulmonary artery catheter is been removed although right IJ sheath remains in place. Telemetry leads overlie the chest. IMPRESSION: 1. Interval removal of pulmonary artery catheter. 2. Otherwise no substantial interval change. No evidence for pneumothorax. Electronically Signed   By: Misty Stanley M.D.   On: 06/11/2021 07:50  ? ?DG Chest Port 1 View ? ?Result Date: 06/10/2021 ?CLINICAL DATA:  Status post coronary bypass graft. EXAM: PORTABLE CHEST 1 VIEW COMPARISON:  June 09, 2021. FINDINGS: Stable cardiomediastinal silhouette. Left-sided chest tube is noted without pneumothorax. Endotracheal and nasogastric tubes have been removed. Swan-Ganz catheter is unchanged. Bibasilar atelectasis is noted with probable small left pleural effusion. Bony thorax is unremarkable. IMPRESSION: Left-sided chest tube is noted without pneumothorax. Bibasilar atelectasis is noted with probable small left pleural effusion. Electronically Signed   By: Marijo Conception M.D.   On: 06/10/2021 08:12  ? ?DG Chest Port 1 View ? ?Result Date: 06/09/2021 ?CLINICAL DATA:  Reason for exam: Pneumothorax EXAM: PORTABLE  CHEST 1 VIEW COMPARISON:  April 6 22. FINDINGS: Right IJ approach central venous catheter with the tip projecting in the expected region the right main pulmonary artery. Endotracheal tube tip projects approximately 6.6 cm above the carina. Gastric tube courses below the diaphragm with the tip in the expected region of the stomach. Left-sided chest tube a please. No visible pneumothorax. Ill-defined left basilar opacities. Right lung is clear. CABG and median sternotomy. IMPRESSION: 1. Postoperative  changes of CABG and support devices, as detailed above. 2. Ill-defined left basilar opacities, potentially atelectasis in the postoperative setting. Electronically Signed   By: Margaretha Sheffield M.D.   On: 04/10

## 2021-06-09 NOTE — Anesthesia Procedure Notes (Signed)
Central Venous Catheter Insertion ?Performed by: Gaynelle Adu, MD, anesthesiologist ?Start/End4/11/2021 6:50 AM, 06/09/2021 7:05 AM ?Patient location: Pre-op. ?Preanesthetic checklist: patient identified, IV checked, site marked, risks and benefits discussed, surgical consent, monitors and equipment checked, pre-op evaluation, timeout performed and anesthesia consent ?Hand hygiene performed  and maximum sterile barriers used  ?PA cath was placed.Swan type:thermodilution ?PA Cath depth:50 ?Procedure performed without using ultrasound guided technique. ?Attempts: 1 ?Patient tolerated the procedure well with no immediate complications. ? ? ? ?

## 2021-06-09 NOTE — Anesthesia Procedure Notes (Signed)
Procedure Name: Intubation ?Date/Time: 06/09/2021 7:49 AM ?Performed by: Carolan Clines, CRNA ?Pre-anesthesia Checklist: Patient identified, Emergency Drugs available, Suction available and Patient being monitored ?Patient Re-evaluated:Patient Re-evaluated prior to induction ?Oxygen Delivery Method: Circle System Utilized ?Preoxygenation: Pre-oxygenation with 100% oxygen ?Induction Type: IV induction ?Ventilation: Mask ventilation without difficulty ?Laryngoscope Size: Mac and 4 ?Grade View: Grade I ?Tube type: Oral ?Number of attempts: 1 ?Airway Equipment and Method: Stylet and Oral airway ?Placement Confirmation: ETT inserted through vocal cords under direct vision, positive ETCO2 and breath sounds checked- equal and bilateral ?Secured at: 23 cm ?Tube secured with: Tape ?Dental Injury: Teeth and Oropharynx as per pre-operative assessment  ?Comments: Intubated by Everlene Other SRNA ? ? ? ? ?

## 2021-06-09 NOTE — Anesthesia Postprocedure Evaluation (Signed)
Anesthesia Post Note ? ?Patient: Nathan Hammond ? ?Procedure(s) Performed: CORONARY ARTERY BYPASS GRAFTING X3 USING LEFT INTERNAL MAMMORY ARTERY AND RIGHT GREATER SAPHENOUS VEIN HARVESTED ENDOSCOPICALLY (Chest) ?TRANSESOPHAGEAL ECHOCARDIOGRAM (TEE) ? ?  ? ?Patient location during evaluation: SICU ?Anesthesia Type: General ?Level of consciousness: sedated ?Pain management: pain level controlled ?Vital Signs Assessment: post-procedure vital signs reviewed and stable ?Respiratory status: patient remains intubated per anesthesia plan ?Cardiovascular status: stable ?Postop Assessment: no apparent nausea or vomiting ?Anesthetic complications: no ? ? ?No notable events documented. ? ?Last Vitals:  ?Vitals:  ? 06/09/21 1415 06/09/21 1500  ?BP:    ?Pulse: 71 88  ?Resp: 12 13  ?Temp: (!) 35.1 ?C (!) 35.2 ?C  ?SpO2: 100% 100%  ?  ?Last Pain:  ?Vitals:  ? 06/09/21 1400  ?TempSrc: Core  ?PainSc:   ? ? ?  ?  ?  ?  ?  ?  ? ?Nathan Hammond,W. EDMOND ? ? ? ? ?

## 2021-06-09 NOTE — Brief Op Note (Addendum)
06/09/2021 ? ?11:33 AM ? ?PATIENT:  Nathan Hammond  56 y.o. male ? ?PRE-OPERATIVE DIAGNOSIS:  CAD ? ?POST-OPERATIVE DIAGNOSIS: CAD ? ?PROCEDURE:  TRANSESOPHAGEAL ECHOCARDIOGRAM (TEE), CORONARY ARTERY BYPASS GRAFTING X 3 (LIMA to LAD, SVG to OM, SVG to RCA) USING LEFT INTERNAL MAMMARY ARTERY AND RIGHT GREATER SAPHENOUS VEIN  ? ?Right EVH harvest time: 28 minutes;Right EVH prep time: 16 minutes ? ?SURGEON:  Surgeon(s) and Role: ?   Dahlia Byes, MD - Primary ? ?PHYSICIAN ASSISTANT: Lars Pinks PA-C ? ?ASSISTANTS: Ardyth Man RNFA  ? ?ANESTHESIA:   general ? ?DRAINS:  Chest tubes placed in the mediastinal and pleural spaces   ? ?COUNTS CORRECT:  YES ? ?DICTATION: .Dragon Dictation ? ?PLAN OF CARE: Admit to inpatient  ? ?PATIENT DISPOSITION:  ICU - intubated and hemodynamically stable. ?  ?Delay start of Pharmacological VTE agent (>24hrs) due to surgical blood loss or risk of bleeding: no ? ?BASELINE WEIGHT: 87.5 kg ?

## 2021-06-09 NOTE — Hospital Course (Addendum)
HPI: ?This is a 56 year old non-smoker with hyperlipidemia was evaluated and found to have a high risk calcium score cardiac CT scan.  He was subsequently followed up in the cardiology office for chest pain with exertion.  He was placed on Imdur, Crestor, and aspirin.  He was scheduled for cardiac catheterization. ?  ?This was performed on March 8.  He has a high-grade proximal LAD stenosis and a high-grade stenosis of a OM 2 branch of the dominant circumflex.  The RCA is small and and nondominant and is filling via collaterals. ?  ?Echocardiogram shows normal LV systolic function.  No significant valvular disease. ?  ?Precath chest x-ray shows clear lung fields ?  ?Since the heart cath the patient has had some intermittent chest pain insomnia anxiety and financial concern over being held out of work.  He has been compliant with his medications including aspirin, Imdur, Crestor. ? ?Hospital Course: ?As dictated by Jadene Pierini PA-C: ?Patient underwent a CABG x 3. He was transferred from the OR to [facility] ICU in stable condition. ? ?Postoperative hospital course: ? ?The patient is overall doing well.  He was extubated using standard post cardiac surgical protocols without difficulty.  He has maintained stable hemodynamics.  Maintain normal sinus rhythm.  He does show evidence of postoperative volume overload but is responding well to diuretics.  Renal function has remained within normal limits.  Oxygen has been weaned and he maintains good saturations on room air.  He does have a mild expected postoperative acute blood loss anemia which is stable. ? ?Addendum: ?Patient was surgically stable for transfer from the ICU to 4E for further convalescence on 04/13. He is maintaining sinus rhythm. Epicardial pacing wires and chest tubes (output decreased) were removed on 04/14. His pre op HGA1C was 5.3 and he has no history of diabetes. Accu checks and SS PRN were stopped. He has been tolerating a diet. He was given a laxative to  assist with bowel movement. He had a large bowel movement on 04/14. He has been ambulating on room air with good oxygenation. Sternal and RLE wounds are clean, dry, and healing without signs of infection. He continues to maintain sinus rhythm. Chest tube sutures will be removed in the office after discharge. Patient requesting a rolling walker, which has been ordered. As discussed with surgeon, patient  is felt surgically stable for discharge.  ? ?

## 2021-06-09 NOTE — Transfer of Care (Signed)
Immediate Anesthesia Transfer of Care Note ? ?Patient: Nathan Hammond ? ?Procedure(s) Performed: CORONARY ARTERY BYPASS GRAFTING X3 USING LEFT INTERNAL MAMMORY ARTERY AND RIGHT GREATER SAPHENOUS VEIN HARVESTED ENDOSCOPICALLY (Chest) ?TRANSESOPHAGEAL ECHOCARDIOGRAM (TEE) ? ?Patient Location: ICU ? ?Anesthesia Type:General ? ?Level of Consciousness: sedated and Patient remains intubated per anesthesia plan ? ?Airway & Oxygen Therapy: Patient remains intubated per anesthesia plan and Patient placed on Ventilator (see vital sign flow sheet for setting) ? ?Post-op Assessment: Report given to RN and Post -op Vital signs reviewed and stable ? ?Post vital signs: Reviewed and stable ? ?Last Vitals:  ?Vitals Value Taken Time  ?BP 118/65   ?Temp    ?Pulse 81 06/09/21 1342  ?Resp    ?SpO2 100 % 06/09/21 1342  ?Vitals shown include unvalidated device data. ? ?Last Pain:  ?Vitals:  ? 06/09/21 0619  ?TempSrc:   ?PainSc: 0-No pain  ?   ? ?  ? ?Complications: No notable events documented. ?

## 2021-06-09 NOTE — Anesthesia Procedure Notes (Signed)
Central Venous Catheter Insertion ?Performed by: Roderic Palau, MD, anesthesiologist ?Start/End4/11/2021 6:50 AM, 06/09/2021 7:05 AM ?Patient location: Pre-op. ?Preanesthetic checklist: patient identified, IV checked, site marked, risks and benefits discussed, surgical consent, monitors and equipment checked, pre-op evaluation, timeout performed and anesthesia consent ?Position: Trendelenburg ?Lidocaine 1% used for infiltration and patient sedated ?Hand hygiene performed , maximum sterile barriers used  and Seldinger technique used ?Catheter size: 9 Fr ?Total catheter length 10. ?Central line was placed.MAC introducer ?Swan type:thermodilution ?Procedure performed using ultrasound guided technique. ?Ultrasound Notes:anatomy identified, needle tip was noted to be adjacent to the nerve/plexus identified, no ultrasound evidence of intravascular and/or intraneural injection and image(s) printed for medical record ?Attempts: 1 ?Following insertion, line sutured, dressing applied and Biopatch. ?Post procedure assessment: blood return through all ports, free fluid flow and no air ? ?Patient tolerated the procedure well with no immediate complications. ? ? ? ? ?

## 2021-06-09 NOTE — Progress Notes (Signed)
Pre Procedure note for inpatients: ?  ?Nathan Hammond has been scheduled for Procedure(s): ?CORONARY ARTERY BYPASS GRAFTING (CABG) (N/A) ?TRANSESOPHAGEAL ECHOCARDIOGRAM (TEE) (N/A) today. The various methods of treatment have been discussed with the patient. After consideration of the risks, benefits and treatment options the patient has consented to the planned procedure.  ? ?The patient has been seen and labs reviewed. There are no changes in the patient?s condition to prevent proceeding with the planned procedure today. ? ?Recent labs: ? ?Lab Results  ?Component Value Date  ? WBC 7.6 06/05/2021  ? HGB 14.3 06/05/2021  ? HCT 42.7 06/05/2021  ? PLT 315 06/05/2021  ? GLUCOSE 89 06/05/2021  ? CHOL 154 05/21/2021  ? TRIG 54 05/21/2021  ? HDL 66 05/21/2021  ? LDLCALC 77 05/21/2021  ? ALT 25 06/05/2021  ? AST 23 06/05/2021  ? NA 136 06/05/2021  ? K 3.9 06/05/2021  ? CL 105 06/05/2021  ? CREATININE 0.96 06/05/2021  ? BUN 19 06/05/2021  ? CO2 21 (L) 06/05/2021  ? INR 1.0 06/05/2021  ? HGBA1C 5.3 06/05/2021  ? ? ?Lovett Sox, MD ?06/09/2021 7:28 AM ? ? ?   ?

## 2021-06-09 NOTE — Procedures (Signed)
Extubation Procedure Note ? ?Patient Details:   ?Name: Nathan Hammond ?DOB: 02/17/1966 ?MRN: [id number] ?  ?Airway Documentation:  ?  ?Vent end date: 06/09/21 Vent end time: 1634  ? ?Evaluation ? O2 sats: stable throughout ?Complications: No apparent complications ?Patient did tolerate procedure well. ?Bilateral Breath Sounds: Clear, Diminished ?  ?Yes ? ?Dewain Penning T ?06/09/2021, 4:38 PM ? ?

## 2021-06-09 NOTE — Progress Notes (Signed)
Patient ID: Nathan Hammond, male   DOB: [birth year], 56 y.o.   MRN: [id number] ? ?TCTS Evening Rounds:  ? ?Hemodynamically stable  ?CI = 2.5 ? ?Extubated ? ?Urine output good  ?CT output low ? ?CBC ?   ?Component Value Date/Time  ? WBC 12.3 (H) 06/09/2021 1403  ? RBC 3.71 (L) 06/09/2021 1403  ? HGB 11.2 (L) 06/09/2021 1624  ? HCT 33.0 (L) 06/09/2021 1624  ? PLT 172 06/09/2021 1403  ? MCV 93.3 06/09/2021 1403  ? MCH 30.7 06/09/2021 1403  ? MCHC 32.9 06/09/2021 1403  ? RDW 13.1 06/09/2021 1403  ? ? ? ?BMET ?   ?Component Value Date/Time  ? NA 139 06/09/2021 1624  ? K 4.4 06/09/2021 1624  ? CL 103 06/09/2021 1225  ? CO2 21 (L) 06/05/2021 1516  ? GLUCOSE 155 (H) 06/09/2021 1225  ? BUN 16 06/09/2021 1225  ? CREATININE 0.60 (L) 06/09/2021 1225  ? CALCIUM 9.7 06/05/2021 1516  ? GFRNONAA >60 06/05/2021 1516  ? ? ? ?A/P:  Stable postop course. Continue current plans ? ?

## 2021-06-09 NOTE — Op Note (Signed)
NAME: Nathan Hammond, Nathan Hammond ?MEDICAL RECORD NO: [id number] ?ACCOUNT NO: [id number] ?DATE OF BIRTH: Mar 22, 1965 ?FACILITY: [facility] ?LOCATION: [facility] ?PHYSICIAN: Len Childs, MD ? ?Operative Report  ? ?DATE OF PROCEDURE: 06/09/2021 ? ? ?PROCEDURES PERFORMED:   ?1.  Coronary artery bypass grafting x3 (left internal mammary artery to LAD, saphenous vein graft to distal circumflex, saphenous vein graft to RCA).   ?2.  Endoscopic harvest of right leg greater saphenous vein. ? ?SURGEON:  Ivin Poot, M.D. ? ?ASSISTANT:  Lars Pinks, PA-C ? ?ANESTHESIA:  General, Dr. Oren Bracket.   ? ?PREOPERATIVE DIAGNOSIS:  Acute coronary syndrome with severe multivessel coronary artery disease and preserved LV systolic function. ? ?POSTOPERATIVE DIAGNOSIS:   Acute coronary syndrome with severe multivessel coronary artery disease and preserved LV systolic function. ? ?SURGICAL ASSISTANT:  A surgical assistant was required for the surgery to meet the standard of care for this level of procedure.  The first assistant was needed for endoscopic harvest of the greater saphenous vein, and closure of the leg incision.  The  ?first assistant was needed for assistance with performing the distal anastomosis for suture management, exposure, suctioning, and general assistance. ? ?CLINICAL NOTE:  The patient is a 56 year old Hispanic male who has been having chest pains of increasing frequency and intensity.  A coronary calcium scan was performed, which showed an elevated calcium score.  He subsequently was evaluated by Cardiology ? and was recommended for cardiac catheterization, which demonstrated high-grade stenosis of the distal circumflex, proximal LAD and chronic occlusion of a codominant RCA.  I evaluated the patient for surgery at the request of his cardiologist.  I agreed  ?with their recommendation that multivessel CABG would be his best long-term therapy.  He has no associated risk factors of smoking, diabetes.  No family  history of significant heart disease.  He has lost 80-90 pounds over the past year following a weight ? loss diet. ? ?After reviewing his coronary angiograms and echocardiogram, I discussed the procedure of CABG with the patient and his wife.  I discussed the expected benefits including relief of angina and improved survival and preservation of LV function.  I discussed ? the potential risks including stroke, bleeding, blood transfusion, infection, organ failure, arrhythmia, death.  After answering all questions, he demonstrated his understanding and agreed to proceed with surgery under what I felt was an informed  ?consent. ? ?DESCRIPTION OF PROCEDURE:  The patient was brought to the operating room and placed supine on the operating table from preoperative holding after informed consent was documented and final issues were addressed in a face-to-face encounter.  The patient  ?remained stable on induction of anesthesia.  A transesophageal echo probe was placed by the anesthesia team.  The patient had a Foley catheter placed and then was prepped and draped as a sterile field. ? ?A proper timeout was performed.  A sternal incision was made as the saphenous vein was harvested endoscopically from the right leg.  The left internal mammary artery was harvested as a pedicle graft from its origin at the subclavian vessels.  It was 1.5  ?mm vessel with good flow.  The sternal retractor was placed and the pericardium was opened and suspended.  Pursestrings were placed in the ascending aorta and right atrium.  Heparin was administered and the ACT was documented as being therapeutic.  The  ?patient was then cannulated and placed on bypass.  The coronaries were identified for grafting.  The RCA was a small 1 mm, but graftable  vessel.  The LAD and circumflex margin were larger 1.5-1.7 mm vessels.  Cardioplegia cannulas were placed both  ?antegrade and retrograde cold blood cardioplegia and the mammary artery and vein grafts were  prepared for the distal anastomosis.  The patient was cooled to 32 degrees and the aortic crossclamp was applied.  One liter of cold blood cardioplegia was  ?delivered in split doses between the antegrade aortic and retrograde coronary sinus catheters.  There was good cardioplegic arrest and septal temperature dropped less than 12 degrees.  Cardioplegia was delivered every 20 minutes. ? ?The distal coronary anastomoses were performed.  The first distal anastomosis was to the RCA.  This was a 1.0 mm vessel with proximal total occlusion.  A reverse saphenous vein was sewn end-to-side with running 7-0 Prolene with good flow through the  ?graft.  Cardioplegia was redosed. ? ?The second distal anastomosis was to the distal circumflex marginal.  This had a proximal 80-90% stenosis.  A reverse saphenous vein was sewn end-to-side with running 7-0 Prolene with good flow through the graft.  Cardioplegia was redosed. ? ?The third distal anastomosis was to the mid LAD.  There was a proximal 80% stenosis.  The left IMA pedicle was brought through an opening in the left lateral pericardium was brought down onto the LAD and sewn end-to-side with running 8-0 Prolene.  There  ?was good flow through the anastomosis after briefly releasing the bulldog on the mammary artery.  The bulldog was reapplied and the pedicle was secured to the epicardium with 6-0 Prolenes.  Cardioplegia was redosed. ? ?While the cross-clamp was still in place, 2 proximal vein anastomoses were performed on the ascending aorta using a 4.5 mm punch and running 6-0 Prolene.  Prior to tying down the final proximal anastomosis air was vented from the coronaries and the left  ?side of the heart using a dose of retrograde warm blood cardioplegia and the usual de-airing maneuvers.  The crossclamp was removed. ? ?The heart resumed a spontaneous rhythm.  The vein grafts were de-aired and opened and each had good flow.  Hemostasis was documented at the proximal and distal  sites.  The patient was rewarmed and reperfused, temporary pacing wires were applied.  The  ?lungs were expanded.  Ventilator was resumed.  The patient was then weaned off cardiopulmonary bypass, not needing inotropic support.  Echo showed preserved LV function.  Protamine was administered without adverse reaction.  The cannulas were removed. ? ?The mediastinum was irrigated.  The superior pericardial fat was closed over the aorta and vein grafts.  Anterior mediastinal and left chest tube were placed and brought out through separate incisions.  The patient remained stable.  The sternum was  ?closed with interrupted steel wire.  The patient remained stable.  The pectoralis fascia was closed with a running #1 Vicryl and the subcutaneous and skin layers were closed with running Vicryl.  Total cardiopulmonary bypass time was 110 minutes.  ? ? ? ?[place] ?D: 06/09/2021 4:29:09 pm T: 06/09/2021 11:42:00 pm  ?JOB: [id number]  ?

## 2021-06-10 ENCOUNTER — Telehealth: Payer: Self-pay

## 2021-06-10 ENCOUNTER — Encounter (HOSPITAL_COMMUNITY): Payer: Self-pay | Admitting: Cardiothoracic Surgery

## 2021-06-10 ENCOUNTER — Inpatient Hospital Stay (HOSPITAL_COMMUNITY): Payer: 59

## 2021-06-10 ENCOUNTER — Ambulatory Visit: Payer: 59 | Admitting: Cardiology

## 2021-06-10 LAB — CBC
HCT: 32.6 % — ABNORMAL LOW (ref 39.0–52.0)
HCT: 31.7 % — ABNORMAL LOW (ref 39.0–52.0)
Hemoglobin: 10.8 g/dL — ABNORMAL LOW (ref 13.0–17.0)
Hemoglobin: 10.5 g/dL — ABNORMAL LOW (ref 13.0–17.0)
MCH: 31.1 pg (ref 26.0–34.0)
MCH: 31.3 pg (ref 26.0–34.0)
MCHC: 33.1 g/dL (ref 30.0–36.0)
MCHC: 33.1 g/dL (ref 30.0–36.0)
MCV: 93.9 fL (ref 80.0–100.0)
MCV: 94.3 fL (ref 80.0–100.0)
Platelets: 200 10*3/uL (ref 150–400)
Platelets: 177 10*3/uL (ref 150–400)
RBC: 3.47 MIL/uL — ABNORMAL LOW (ref 4.22–5.81)
RBC: 3.36 MIL/uL — ABNORMAL LOW (ref 4.22–5.81)
RDW: 13.5 % (ref 11.5–15.5)
RDW: 13.7 % (ref 11.5–15.5)
WBC: 11.5 10*3/uL — ABNORMAL HIGH (ref 4.0–10.5)
WBC: 11.6 10*3/uL — ABNORMAL HIGH (ref 4.0–10.5)
nRBC: 0 % (ref 0.0–0.2)
nRBC: 0 % (ref 0.0–0.2)

## 2021-06-10 LAB — BASIC METABOLIC PANEL
Anion gap: 7 (ref 5–15)
Anion gap: 7 (ref 5–15)
BUN: 14 mg/dL (ref 6–20)
BUN: 17 mg/dL (ref 6–20)
CO2: 21 mmol/L — ABNORMAL LOW (ref 22–32)
CO2: 24 mmol/L (ref 22–32)
Calcium: 8.3 mg/dL — ABNORMAL LOW (ref 8.9–10.3)
Calcium: 8.4 mg/dL — ABNORMAL LOW (ref 8.9–10.3)
Chloride: 109 mmol/L (ref 98–111)
Chloride: 103 mmol/L (ref 98–111)
Creatinine, Ser: 0.95 mg/dL (ref 0.61–1.24)
Creatinine, Ser: 1.01 mg/dL (ref 0.61–1.24)
GFR, Estimated: >60 mL/min (ref >60)
GFR, Estimated: >60 mL/min (ref >60)
Glucose, Bld: 112 mg/dL — ABNORMAL HIGH (ref 70–99)
Glucose, Bld: 133 mg/dL — ABNORMAL HIGH (ref 70–99)
Potassium: 3.9 mmol/L (ref 3.5–5.1)
Potassium: 3.7 mmol/L (ref 3.5–5.1)
Sodium: 137 mmol/L (ref 135–145)
Sodium: 134 mmol/L — ABNORMAL LOW (ref 135–145)

## 2021-06-10 LAB — GLUCOSE, CAPILLARY
Glucose-Capillary: 112 mg/dL — ABNORMAL HIGH (ref 70–99)
Glucose-Capillary: 93 mg/dL (ref 70–99)
Glucose-Capillary: 156 mg/dL — ABNORMAL HIGH (ref 70–99)
Glucose-Capillary: 165 mg/dL — ABNORMAL HIGH (ref 70–99)
Glucose-Capillary: 132 mg/dL — ABNORMAL HIGH (ref 70–99)
Glucose-Capillary: 110 mg/dL — ABNORMAL HIGH (ref 70–99)
Glucose-Capillary: 111 mg/dL — ABNORMAL HIGH (ref 70–99)

## 2021-06-10 LAB — MAGNESIUM
Magnesium: 2.3 mg/dL (ref 1.7–2.4)
Magnesium: 2.4 mg/dL (ref 1.7–2.4)

## 2021-06-10 MED ORDER — MIDAZOLAM HCL 2 MG/2ML IJ SOLN: 2.0000 mg | Freq: Four times a day (QID) | INTRAMUSCULAR | Status: DC | PRN

## 2021-06-10 MED ORDER — POTASSIUM CHLORIDE CRYS ER 20 MEQ PO TBCR
20.0000 meq | EXTENDED_RELEASE_TABLET | ORAL | Status: AC
  Administered 2021-06-10 – 2021-06-11 (×3): 20 meq via ORAL
  Filled 2021-06-10 (×3): qty 1

## 2021-06-10 MED ORDER — FENTANYL CITRATE PF 50 MCG/ML IJ SOSY: 50.0000 ug | PREFILLED_SYRINGE | INTRAMUSCULAR | Status: DC | PRN

## 2021-06-10 MED ORDER — INSULIN ASPART 100 UNIT/ML IJ SOLN
0.0000 [IU] | INTRAMUSCULAR | Status: DC
  Administered 2021-06-10: 2 [IU] via SUBCUTANEOUS
  Administered 2021-06-10: 4 [IU] via SUBCUTANEOUS

## 2021-06-10 MED ORDER — KETOROLAC TROMETHAMINE 30 MG/ML IJ SOLN
30.0000 mg | Freq: Four times a day (QID) | INTRAMUSCULAR | Status: AC
  Administered 2021-06-10 (×2): 30 mg via INTRAVENOUS
  Filled 2021-06-10 (×2): qty 1

## 2021-06-10 MED ORDER — FUROSEMIDE 10 MG/ML IJ SOLN
20.0000 mg | Freq: Two times a day (BID) | INTRAMUSCULAR | Status: DC
  Administered 2021-06-10 (×2): 20 mg via INTRAVENOUS
  Filled 2021-06-10 (×2): qty 2

## 2021-06-10 NOTE — Telephone Encounter (Signed)
FMLA/STD forms completed and Faxed to [employer] @ [phone]. ?LOA begins 06/09/21 through approx 09/08/21. ?Forms mailed to home address. ?

## 2021-06-10 NOTE — Progress Notes (Signed)
CT surgery PM rounds  ? ?Patient had a stable day ambulated in hallway ?Maintaining sinus rhythm ?Pain adequately controlled ?P.m. labs reviewed are satisfactory supplement K for 3.7 ? ?Blood pressure (!) 106/55, pulse 72, temperature 97.9 ?F (36.6 ?C), resp. rate 20, height 5\' 8"  (1.727 m), weight 94.4 kg, SpO2 97 %.  ?

## 2021-06-10 NOTE — TOC Progression Note (Signed)
Transition of Care (TOC) - Progression Note  ? ? ?Patient Details  ?Name: Nathan Hammond ?MRN: [id number] ?Date of Birth: December 28, 1965 ? ?Transition of Care (TOC) CM/SW Contact  ?Beckie Busing, RN ?Phone [phone] ? ?06/10/2021, 4:15 PM ? ?Clinical Narrative:    ? ?Transition of Care (TOC) Screening Note ? ? ?Patient Details  ?Name: Nathan Hammond ?Date of Birth: 30-Mar-1965 ? ? ?Transition of Care (TOC) CM/SW Contact:    ?Beckie Busing, RN ?Phone Number: ?06/10/2021, 4:15 PM ? ? ? ?Transition of Care Department [facility]) has reviewed patient and no TOC needs have been identified at this time. We will continue to monitor patient advancement through interdisciplinary progression rounds. If new patient transition needs arise, please place a TOC consult. ? ? ? ? ?  ?  ? ?Expected Discharge Plan and Services ?  ?  ?  ?  ?  ?                ?  ?  ?  ?  ?  ?  ?  ?  ?  ?  ? ? ?Social Determinants of Health (SDOH) Interventions ?  ? ?Readmission Risk Interventions ?   ? View : No data to display.  ?  ?  ?  ? ? ?

## 2021-06-10 NOTE — Progress Notes (Signed)
EKG CRITICAL VALUE  ? ? ? ?12 lead EKG performed.  Critical value noted.  Althea Grimmer, RN notified. ? ? ?Alto Denver, CCT ?06/10/2021 7:50 AM  ? ?

## 2021-06-11 ENCOUNTER — Inpatient Hospital Stay (HOSPITAL_COMMUNITY): Payer: 59

## 2021-06-11 LAB — BASIC METABOLIC PANEL
Anion gap: 7 (ref 5–15)
BUN: 17 mg/dL (ref 6–20)
CO2: 23 mmol/L (ref 22–32)
Calcium: 8.4 mg/dL — ABNORMAL LOW (ref 8.9–10.3)
Chloride: 103 mmol/L (ref 98–111)
Creatinine, Ser: 0.9 mg/dL (ref 0.61–1.24)
GFR, Estimated: >60 mL/min (ref >60)
Glucose, Bld: 120 mg/dL — ABNORMAL HIGH (ref 70–99)
Potassium: 3.7 mmol/L (ref 3.5–5.1)
Sodium: 133 mmol/L — ABNORMAL LOW (ref 135–145)

## 2021-06-11 LAB — CBC
HCT: 30.9 % — ABNORMAL LOW (ref 39.0–52.0)
Hemoglobin: 10.2 g/dL — ABNORMAL LOW (ref 13.0–17.0)
MCH: 30.9 pg (ref 26.0–34.0)
MCHC: 33 g/dL (ref 30.0–36.0)
MCV: 93.6 fL (ref 80.0–100.0)
Platelets: 173 10*3/uL (ref 150–400)
RBC: 3.3 MIL/uL — ABNORMAL LOW (ref 4.22–5.81)
RDW: 13.5 % (ref 11.5–15.5)
WBC: 13.6 10*3/uL — ABNORMAL HIGH (ref 4.0–10.5)
nRBC: 0 % (ref 0.0–0.2)

## 2021-06-11 LAB — GLUCOSE, CAPILLARY
Glucose-Capillary: 119 mg/dL — ABNORMAL HIGH (ref 70–99)
Glucose-Capillary: 134 mg/dL — ABNORMAL HIGH (ref 70–99)
Glucose-Capillary: 129 mg/dL — ABNORMAL HIGH (ref 70–99)
Glucose-Capillary: 98 mg/dL (ref 70–99)
Glucose-Capillary: 104 mg/dL — ABNORMAL HIGH (ref 70–99)

## 2021-06-11 MED ORDER — MAGNESIUM HYDROXIDE 400 MG/5ML PO SUSP: 30.0000 mL | Freq: Every day | ORAL | Status: DC | PRN

## 2021-06-11 MED ORDER — SODIUM CHLORIDE 0.9 % IV SOLN: 250.0000 mL | INTRAVENOUS | Status: DC | PRN

## 2021-06-11 MED ORDER — POTASSIUM CHLORIDE CRYS ER 20 MEQ PO TBCR
20.0000 meq | EXTENDED_RELEASE_TABLET | Freq: Two times a day (BID) | ORAL | Status: DC
  Administered 2021-06-11 – 2021-06-14 (×7): 20 meq via ORAL
  Filled 2021-06-11 (×7): qty 1

## 2021-06-11 MED ORDER — ~~LOC~~ CARDIAC SURGERY, PATIENT & FAMILY EDUCATION: Freq: Once | Status: AC

## 2021-06-11 MED ORDER — MIDAZOLAM HCL 2 MG/2ML IJ SOLN: 2.0000 mg | Freq: Four times a day (QID) | INTRAMUSCULAR | Status: DC | PRN

## 2021-06-11 MED ORDER — SODIUM CHLORIDE 0.9% FLUSH
3.0000 mL | Freq: Two times a day (BID) | INTRAVENOUS | Status: DC
  Administered 2021-06-11 – 2021-06-12 (×2): 3 mL via INTRAVENOUS

## 2021-06-11 MED ORDER — EZETIMIBE 10 MG PO TABS
10.0000 mg | ORAL_TABLET | Freq: Every day | ORAL | Status: DC
  Administered 2021-06-12 – 2021-06-14 (×3): 10 mg via ORAL
  Filled 2021-06-11 (×3): qty 1

## 2021-06-11 MED ORDER — FUROSEMIDE 10 MG/ML IJ SOLN
40.0000 mg | Freq: Every day | INTRAMUSCULAR | Status: DC
  Administered 2021-06-11 – 2021-06-12 (×2): 40 mg via INTRAVENOUS
  Filled 2021-06-11 (×2): qty 4

## 2021-06-11 MED ORDER — INSULIN ASPART 100 UNIT/ML IJ SOLN
0.0000 [IU] | Freq: Three times a day (TID) | INTRAMUSCULAR | Status: DC
  Administered 2021-06-11: 2 [IU] via SUBCUTANEOUS

## 2021-06-11 MED ORDER — ENOXAPARIN SODIUM 40 MG/0.4ML IJ SOSY
40.0000 mg | PREFILLED_SYRINGE | Freq: Every day | INTRAMUSCULAR | Status: DC
  Administered 2021-06-11 – 2021-06-12 (×2): 40 mg via SUBCUTANEOUS
  Filled 2021-06-11 (×2): qty 0.4

## 2021-06-11 MED ORDER — SODIUM CHLORIDE 0.9% FLUSH: 3.0000 mL | INTRAVENOUS | Status: DC | PRN

## 2021-06-11 MED FILL — Sodium Bicarbonate IV Soln 8.4%: INTRAVENOUS | Qty: 50 | Status: AC

## 2021-06-11 MED FILL — Electrolyte-R (PH 7.4) Solution: INTRAVENOUS | Qty: 3000 | Status: AC

## 2021-06-11 MED FILL — Magnesium Sulfate Inj 50%: INTRAMUSCULAR | Qty: 10 | Status: AC

## 2021-06-11 MED FILL — Sodium Chloride IV Soln 0.9%: INTRAVENOUS | Qty: 2000 | Status: AC

## 2021-06-11 MED FILL — Lidocaine HCl Local Preservative Free (PF) Inj 1%: INTRAMUSCULAR | Qty: 2 | Status: AC

## 2021-06-11 MED FILL — Heparin Sodium (Porcine) Inj 1000 Unit/ML: INTRAMUSCULAR | Qty: 30 | Status: AC

## 2021-06-11 MED FILL — Potassium Chloride Inj 2 mEq/ML: INTRAVENOUS | Qty: 20 | Status: AC

## 2021-06-11 MED FILL — Heparin Sodium (Porcine) Inj 1000 Unit/ML: Qty: 1000 | Status: AC

## 2021-06-11 MED FILL — Mannitol IV Soln 20%: INTRAVENOUS | Qty: 500 | Status: AC

## 2021-06-11 NOTE — Progress Notes (Signed)
2 Days Post-Op Procedure(s) (LRB): ?CORONARY ARTERY BYPASS GRAFTING X3 USING LEFT INTERNAL MAMMORY ARTERY AND RIGHT GREATER SAPHENOUS VEIN HARVESTED ENDOSCOPICALLY (N/A) ?TRANSESOPHAGEAL ECHOCARDIOGRAM (TEE) (N/A) ?Subjective: ?Pain is better- walked in hall ? ?Objective: ?Vital signs in last 24 hours: ?Temp:  [97.9 ?F (36.6 ?C)-98.8 ?F (37.1 ?C)] 98.2 ?F (36.8 ?C) (04/12 0800) ?Pulse Rate:  [71-88] 80 (04/12 0700) ?Cardiac Rhythm: Normal sinus rhythm (04/12 0400) ?Resp:  [14-27] 23 (04/12 0700) ?BP: (84-116)/(55-78) 116/70 (04/12 0700) ?SpO2:  [90 %-100 %] 100 % (04/12 0700) ?Weight:  [95.2 kg] 95.2 kg (04/12 0500) ? ?Hemodynamic parameters for last 24 hours: ? nsr ? ?Intake/Output from previous day: ?04/11 0701 - 04/12 0700 ?In: 919.9 [P.O.:240; I.V.:409.5; IV Piggyback:270.3] ?Out: 2010 [Urine:1830; Chest Tube:180] ?Intake/Output this shift: ?Total I/O ?In: -  ?Out: 100 [Urine:50; Chest Tube:50] ? ?EXAM ? ?  ?   Exam ? ?  General- alert and comfortable ?   Neck- no JVD, no cervical adenopathy palpable, no carotid bruit ?  Lungs- clear without rales, wheezes ?  Cor- regular rate and rhythm, no murmur , gallop ?  Abdomen- soft, non-tender ?  Extremities - warm, non-tender, minimal edema ?  Neuro- oriented, appropriate, no focal weakness  ? ?Lab Results: ?Recent Labs  ?  06/10/21 ?1638 06/11/21 ?0315  ?WBC 11.6* 13.6*  ?HGB 10.5* 10.2*  ?HCT 31.7* 30.9*  ?PLT 177 173  ? ?BMET:  ?Recent Labs  ?  06/10/21 ?1638 06/11/21 ?0315  ?NA 134* 133*  ?K 3.7 3.7  ?CL 103 103  ?CO2 24 23  ?GLUCOSE 133* 120*  ?BUN 17 17  ?CREATININE 1.01 0.90  ?CALCIUM 8.4* 8.4*  ?  ?PT/INR:  ?Recent Labs  ?  06/09/21 ?1403  ?LABPROT 16.1*  ?INR 1.3*  ? ?ABG ?   ?Component Value Date/Time  ? PHART 7.344 (L) 06/09/2021 1749  ? HCO3 21.8 06/09/2021 1749  ? TCO2 23 06/09/2021 1749  ? ACIDBASEDEF 4.0 (H) 06/09/2021 1749  ? O2SAT 98 06/09/2021 1749  ? ?CBG (last 3)  ?Recent Labs  ?  06/10/21 ?2317 06/11/21 ?3151 06/11/21 ?0759  ?GLUCAP 111* 119* 134*   ? ? ?Assessment/Plan: ?S/P Procedure(s) (LRB): ?CORONARY ARTERY BYPASS GRAFTING X3 USING LEFT INTERNAL MAMMORY ARTERY AND RIGHT GREATER SAPHENOUS VEIN HARVESTED ENDOSCOPICALLY (N/A) ?TRANSESOPHAGEAL ECHOCARDIOGRAM (TEE) (N/A) ?Mobilize ?Diuresis ?d/c tubes/lines ?Plan for transfer to step-down: see transfer orders ?Leave EPWs today ? LOS: 2 days  ? ? ?Nathan Hammond ?06/11/2021 ?  ?

## 2021-06-11 NOTE — Progress Notes (Signed)
After ambulating patient dumped 240 ml from chest tube. Dr. Maren Beach previously advised during AM rounds to not pull chest tubes if pt has more than 150 ml output.  Output documented accordingly, will notify MD of inability to pull chest tubes at this time.  ?

## 2021-06-12 ENCOUNTER — Inpatient Hospital Stay (HOSPITAL_COMMUNITY): Payer: 59

## 2021-06-12 LAB — CBC
HCT: 31 % — ABNORMAL LOW (ref 39.0–52.0)
Hemoglobin: 10.4 g/dL — ABNORMAL LOW (ref 13.0–17.0)
MCH: 31.4 pg (ref 26.0–34.0)
MCHC: 33.5 g/dL (ref 30.0–36.0)
MCV: 93.7 fL (ref 80.0–100.0)
Platelets: 192 10*3/uL (ref 150–400)
RBC: 3.31 MIL/uL — ABNORMAL LOW (ref 4.22–5.81)
RDW: 13.4 % (ref 11.5–15.5)
WBC: 12.8 10*3/uL — ABNORMAL HIGH (ref 4.0–10.5)
nRBC: 0 % (ref 0.0–0.2)

## 2021-06-12 LAB — BASIC METABOLIC PANEL
Anion gap: 6 (ref 5–15)
BUN: 15 mg/dL (ref 6–20)
CO2: 25 mmol/L (ref 22–32)
Calcium: 8.5 mg/dL — ABNORMAL LOW (ref 8.9–10.3)
Chloride: 108 mmol/L (ref 98–111)
Creatinine, Ser: 0.98 mg/dL (ref 0.61–1.24)
GFR, Estimated: >60 mL/min (ref >60)
Glucose, Bld: 101 mg/dL — ABNORMAL HIGH (ref 70–99)
Potassium: 4.2 mmol/L (ref 3.5–5.1)
Sodium: 139 mmol/L (ref 135–145)

## 2021-06-12 LAB — GLUCOSE, CAPILLARY
Glucose-Capillary: 88 mg/dL (ref 70–99)
Glucose-Capillary: 100 mg/dL — ABNORMAL HIGH (ref 70–99)
Glucose-Capillary: 104 mg/dL — ABNORMAL HIGH (ref 70–99)
Glucose-Capillary: 96 mg/dL (ref 70–99)

## 2021-06-12 MED ORDER — FUROSEMIDE 40 MG PO TABS
40.0000 mg | ORAL_TABLET | Freq: Every day | ORAL | Status: DC
  Administered 2021-06-13 – 2021-06-14 (×2): 40 mg via ORAL
  Filled 2021-06-12 (×2): qty 1

## 2021-06-12 NOTE — Plan of Care (Signed)
?  Problem: Education: ?Goal: Knowledge of General Education information will improve ?Description: Including pain rating scale, medication(s)/side effects and non-pharmacologic comfort measures ?Outcome: Progressing ?  ?Problem: Health Behavior/Discharge Planning: ?Goal: Ability to manage health-related needs will improve ?Outcome: Progressing ?  ?Problem: Clinical Measurements: ?Goal: Ability to maintain clinical measurements within normal limits will improve ?Outcome: Progressing ?Goal: Will remain free from infection ?Outcome: Progressing ?Goal: Diagnostic test results will improve ?Outcome: Progressing ?Goal: Respiratory complications will improve ?Outcome: Progressing ?Goal: Cardiovascular complication will be avoided ?Outcome: Progressing ?  ?Problem: Clinical Measurements: ?Goal: Will remain free from infection ?Outcome: Progressing ?  ?Problem: Elimination: ?Goal: Will not experience complications related to bowel motility ?Outcome: Progressing ?Goal: Will not experience complications related to urinary retention ?Outcome: Progressing ?  ?Problem: Safety: ?Goal: Ability to remain free from injury will improve ?Outcome: Progressing ?  ?Problem: Respiratory: ?Goal: Respiratory status will improve ?Outcome: Progressing ?  ?Problem: Skin Integrity: ?Goal: Wound healing without signs and symptoms of infection ?Outcome: Progressing ?Goal: Risk for impaired skin integrity will decrease ?Outcome: Progressing ?  ?Problem: Urinary Elimination: ?Goal: Ability to achieve and maintain adequate renal perfusion and functioning will improve ?Outcome: Progressing ?  ?

## 2021-06-12 NOTE — Progress Notes (Signed)
CARDIAC REHAB PHASE I  ? ?PRE:  Rate/Rhythm: 76 SR ? ?BP:  Sitting: 112/65     ? ?SaO2: 95 RA ? ?MODE:  Ambulation: 450 ft  ? ?POST:  Rate/Rhythm: 86 SR ? ?BP:  Sitting: 110/70 ? ?  SaO2: 96 RA ? ? ?Pt ambulated 472ft in hallway standby assist with front wheel walker. Pt denies CP, SOB, or dizziness. Pt returned to recliner. Demonstrating ~1000 on IS. Encouraged continued ambulation and IS use. Will continue to follow. ? ?[phone] ?Rufina Falco, RN BSN ?06/12/2021 ?10:31 AM ? ?

## 2021-06-12 NOTE — Progress Notes (Addendum)
? ?   ?[address] ?      [place] [address] ?            [phone]   ? ?  3 Days Post-Op Procedure(s) (LRB): ?CORONARY ARTERY BYPASS GRAFTING X3 USING LEFT INTERNAL MAMMORY ARTERY AND RIGHT GREATER SAPHENOUS VEIN HARVESTED ENDOSCOPICALLY (N/A) ?TRANSESOPHAGEAL ECHOCARDIOGRAM (TEE) (N/A) ?Subjective: ?Pain has been bad at times but meds helping ? ?Objective: ?Vital signs in last 24 hours: ?Temp:  [98.2 ?F (36.8 ?C)-99.3 ?F (37.4 ?C)] 99.3 ?F (37.4 ?C) (04/13 0348) ?Pulse Rate:  [76-90] 81 (04/13 0348) ?Cardiac Rhythm: Normal sinus rhythm (04/13 0750) ?Resp:  [13-26] 20 (04/13 0348) ?BP: (98-124)/(61-73) 99/61 (04/13 0348) ?SpO2:  [93 %-100 %] 96 % (04/13 0348) ? ?Hemodynamic parameters for last 24 hours: ?  ? ?Intake/Output from previous day: ?04/12 0701 - 04/13 0700 ?In: 272.6 [P.O.:240; I.V.:2.7; IV Piggyback:29.9] ?Out: 3470 [Urine:3100; Chest Tube:370] ?Intake/Output this shift: ?No intake/output data recorded. ? ?General appearance: alert, cooperative, and no distress ?Heart: regular rate and rhythm ?Lungs: clear to auscultation bilaterally ?Abdomen: benign ?Extremities: no edema ?Wound: evh sites healing well, chest dressing in place ? ?Lab Results: ?Recent Labs  ?  06/11/21 ?8099 06/12/21 ?0208  ?WBC 13.6* 12.8*  ?HGB 10.2* 10.4*  ?HCT 30.9* 31.0*  ?PLT 173 192  ? ?BMET:  ?Recent Labs  ?  06/11/21 ?8338 06/12/21 ?0208  ?NA 133* 139  ?K 3.7 4.2  ?CL 103 108  ?CO2 23 25  ?GLUCOSE 120* 101*  ?BUN 17 15  ?CREATININE 0.90 0.98  ?CALCIUM 8.4* 8.5*  ?  ?PT/INR:  ?Recent Labs  ?  06/09/21 ?1403  ?LABPROT 16.1*  ?INR 1.3*  ? ?ABG ?   ?Component Value Date/Time  ? PHART 7.344 (L) 06/09/2021 1749  ? HCO3 21.8 06/09/2021 1749  ? TCO2 23 06/09/2021 1749  ? ACIDBASEDEF 4.0 (H) 06/09/2021 1749  ? O2SAT 98 06/09/2021 1749  ? ?CBG (last 3)  ?Recent Labs  ?  06/11/21 ?1624 06/11/21 ?2042 06/12/21 ?0618  ?GLUCAP 98 104* 88  ? ? ?Meds ?Scheduled Meds: ? acetaminophen  1,000 mg Oral Q6H  ? Or  ?  acetaminophen (TYLENOL) oral liquid 160 mg/5 mL  1,000 mg Per Tube Q6H  ? aspirin EC  325 mg Oral Daily  ? Or  ? aspirin  324 mg Per Tube Daily  ? bisacodyl  10 mg Oral Daily  ? Or  ? bisacodyl  10 mg Rectal Daily  ? docusate sodium  200 mg Oral Daily  ? enoxaparin (LOVENOX) injection  40 mg Subcutaneous Daily  ? ezetimibe  10 mg Oral Daily  ? furosemide  40 mg Intravenous Daily  ? insulin aspart  0-24 Units Subcutaneous TID AC & HS  ? metoprolol tartrate  12.5 mg Oral BID  ? Or  ? metoprolol tartrate  12.5 mg Per Tube BID  ? multivitamin with minerals  1 tablet Oral Daily  ? pantoprazole  40 mg Oral Daily  ? potassium chloride  20 mEq Oral BID  ? rosuvastatin  40 mg Oral Daily  ? sodium chloride flush  10-40 mL Intracatheter Q12H  ? sodium chloride flush  3 mL Intravenous Q12H  ? sodium chloride flush  3 mL Intravenous Q12H  ? ?Continuous Infusions: ? sodium chloride Stopped (06/10/21 0842)  ? sodium chloride    ? sodium chloride 20 mL/hr at 06/09/21 1414  ? sodium chloride    ? lactated ringers    ? lactated ringers Stopped (  06/11/21 1856)  ? nitroGLYCERIN Stopped (06/09/21 1406)  ? ?PRN Meds:.sodium chloride, sodium chloride, ALPRAZolam, fentaNYL (SUBLIMAZE) injection, magnesium hydroxide, metoprolol tartrate, midazolam, ondansetron (ZOFRAN) IV, oxyCODONE, sodium chloride flush, sodium chloride flush, sodium chloride flush, traMADol ? ?Xrays ?DG Chest Port 1 View ? ?Result Date: 06/11/2021 ?CLINICAL DATA:  Status post CABG. EXAM: PORTABLE CHEST 1 VIEW COMPARISON:  06/10/2021 FINDINGS: Low volume film. The cardio pericardial silhouette is enlarged. Left chest tube remains in place without evidence for left-sided pneumothorax. There is bibasilar atelectasis, similar to prior. Pulmonary artery catheter is been removed although right IJ sheath remains in place. Telemetry leads overlie the chest. IMPRESSION: 1. Interval removal of pulmonary artery catheter. 2. Otherwise no substantial interval change. No evidence for  pneumothorax. Electronically Signed   By: Kennith Center M.D.   On: 06/11/2021 07:50   ? ?Assessment/Plan: ?S/P Procedure(s) (LRB): ?CORONARY ARTERY BYPASS GRAFTING X3 USING LEFT INTERNAL MAMMORY ARTERY AND RIGHT GREATER SAPHENOUS VEIN HARVESTED ENDOSCOPICALLY (N/A) ?TRANSESOPHAGEAL ECHOCARDIOGRAM (TEE) (N/A) ? ?POD#3 ?1 Tmax 99.3, VSS s BP 98-120's, sinus rhythm ?2 sats good on RA ?3 excellent UOP, not weighed yet, normal renal fxn ?4 minor leukocytosis , trending lower, reactive ?5 stable expected ABLA ? 6 BS well controlled ?7 CXR- + atx, no pntx, no significant effusions ?8 mod CT drainage, 370 cc yesterday but only 30 in last 12 hours- will d/c tube ?9 cont rehab/pulm hygiene ?10 poss home 1-2 days if no new issues ? ? ? ? LOS: 3 days  ?Addendum- has had about 200 cc out of chest tube so far tiday- will leave another day ? ?Rowe Clack PA-C ?Pager [phone] ? ?06/12/2021 ?  Doing well  ?  DC EPWs Friday and home later Fri or am Sat ?Start plavix /81 ASA  at DC after wires out ? ?patient examined and medical record reviewed,agree with above note. ?Lovett Sox ?06/12/2021 ? ?

## 2021-06-12 NOTE — Progress Notes (Signed)
Mobility Specialist Progress Note ? ? 06/12/21 1444  ?Therapy Vitals  ?Temp 97.8 ?F (36.6 ?C)  ?Temp Source Oral  ?Pulse Rate 82  ?Resp 20  ?BP 110/70  ?Patient Position (if appropriate) Sitting  ?Oxygen Therapy  ?SpO2 100 %  ?Mobility  ?Activity Ambulated with assistance in hallway  ?Level of Assistance Contact guard assist, steadying assist  ?Assistive Device Front wheel walker  ?Distance Ambulated (ft) 470 ft  ?Activity Response Tolerated well  ?$Mobility charge 1 Mobility  ? ?Received pt in chair having no c/o and agreeable. Able to reiterate sternal precautions prior to ambulation and following precautions as well. Returned back to doorway where pt then ambulated w/o a RW w/ no faults to the sink in set up for a bath w/ the NT. NT in room. ? ?Nathan Hammond ?Mobility Specialist ?Phone Number [phone] ? ?

## 2021-06-13 LAB — GLUCOSE, CAPILLARY
Glucose-Capillary: 79 mg/dL (ref 70–99)
Glucose-Capillary: 89 mg/dL (ref 70–99)
Glucose-Capillary: 114 mg/dL — ABNORMAL HIGH (ref 70–99)

## 2021-06-13 MED ORDER — SORBITOL 70 % SOLN
60.0000 mL | Freq: Once | Status: DC
  Filled 2021-06-13: qty 60

## 2021-06-13 MED ORDER — ENOXAPARIN SODIUM 40 MG/0.4ML IJ SOSY
40.0000 mg | PREFILLED_SYRINGE | Freq: Every day | INTRAMUSCULAR | Status: DC
Start: 2021-06-13 — End: 2021-06-14
  Administered 2021-06-13: 40 mg via SUBCUTANEOUS
  Filled 2021-06-13 (×2): qty 0.4

## 2021-06-13 MED ORDER — LACTULOSE 10 GM/15ML PO SOLN: 20.0000 g | Freq: Once | ORAL | Status: DC

## 2021-06-13 MED ORDER — ASPIRIN 325 MG PO TBEC: 325.0000 mg | DELAYED_RELEASE_TABLET | Freq: Every day | ORAL | 0 refills | Status: DC

## 2021-06-13 NOTE — Progress Notes (Signed)
Chest tube removed per order. No complications. Patient educated on signs and systems to look out for. Patient informed and verbally agreed bedrest for 1 hour.  ?Nathan Hammond  ?

## 2021-06-13 NOTE — Progress Notes (Signed)
Pacer wires removed per order. No complications. Patient educated on signs and symptoms to look out for. Patient informed and verbally agreed bedrest for 1 hour.   ?Nathan Hammond  ?

## 2021-06-13 NOTE — Discharge Instructions (Signed)

## 2021-06-13 NOTE — Progress Notes (Signed)
CARDIAC REHAB PHASE I  ? ?PRE:  Rate/Rhythm: 81 SR ? ?BP:  Sitting: 101/52     ? ?SaO2: 95 RA ? ?MODE:  Ambulation: 910 ft  ? ?POST:  Rate/Rhythm: 94 SR ? ?BP:  Sitting: 122/75 ? ?  SaO2: 96 RA ? ? ?Pt ambulated 970ft in hallway independently with front wheel walker. Pt denies CP, SOB, or dizziness. Pt returned to recliner. Encouraged continued IS use and walks. Will complete education with wife tomorrow. ? ?[phone] ?Reynold Bowen, RN BSN ?06/13/2021 ?3:01 PM ? ?

## 2021-06-13 NOTE — Progress Notes (Addendum)
? ?   ?  [address] ?      [place] [address] ?            [phone]   ? ?  ? ? ?4 Days Post-Op Procedure(s) (LRB): ?CORONARY ARTERY BYPASS GRAFTING X3 USING LEFT INTERNAL MAMMORY ARTERY AND RIGHT GREATER SAPHENOUS VEIN HARVESTED ENDOSCOPICALLY (N/A) ?TRANSESOPHAGEAL ECHOCARDIOGRAM (TEE) (N/A) ? ?Subjective: ?Patient passing gas but no bowel movement. He has no other complaints this am. Wife at bedside. ? ?Objective: ?Vital signs in last 24 hours: ?Temp:  [97.8 ?F (36.6 ?C)-98.3 ?F (36.8 ?C)] 98.3 ?F (36.8 ?C) (04/14 0430) ?Pulse Rate:  [74-84] 74 (04/14 0430) ?Cardiac Rhythm: Normal sinus rhythm (04/14 0340) ?Resp:  [18-20] 20 (04/14 0430) ?BP: (100-110)/(57-70) 109/57 (04/14 0430) ?SpO2:  [96 %-100 %] 96 % (04/14 0430) ?Weight:  [91.3 kg] 91.3 kg (04/14 0430) ? ?Pre op weight 87.5 kg ?Current Weight  ?06/13/21 91.3 kg  ? ?  ? ?Intake/Output from previous day: ?04/13 0701 - 04/14 0700 ?In: -  ?Out: [id number] [Urine:1300; Chest Tube:240] ? ? ?Physical Exam: ? ?Cardiovascular: RRR ?Pulmonary: Diminished throughout but clear ?Abdomen: Soft, non tender, bowel sounds present. ?Extremities: Mild bilateral lower extremity edema. ?Wounds: Clean and dry.  No erythema or signs of infection. ? ?Lab Results: ?CBC: ?Recent Labs  ?  06/11/21 ?[id number] 06/12/21 ?0208  ?WBC 13.6* 12.8*  ?HGB 10.2* 10.4*  ?HCT 30.9* 31.0*  ?PLT 173 192  ? ?BMET:  ?Recent Labs  ?  06/11/21 ?[id number] 06/12/21 ?0208  ?NA 133* 139  ?K 3.7 4.2  ?CL 103 108  ?CO2 23 25  ?GLUCOSE 120* 101*  ?BUN 17 15  ?CREATININE 0.90 0.98  ?CALCIUM 8.4* 8.5*  ?  ?PT/INR:  ?Lab Results  ?Component Value Date  ? INR 1.3 (H) 06/09/2021  ? INR 1.0 06/05/2021  ? ?ABG:  ?INR: ?Will add last result for INR, ABG once components are confirmed ?Will add last 4 CBG results once components are confirmed ? ?Assessment/Plan: ? ?1. CV - SR with HR in the 80's. On Lopressor 12.5 mg bid ?2.  Pulmonary - On room air. Chest tubes with 240 cc last 24 hours. Check CXR in am.  Encourage incentive spirometer. ?3. Volume Overload - On Lasix 40 mg daily. ?4.  Expected post op acute blood loss anemia - Last H and H stable at 10.4 and 31. ?5. CBGs 104/96/79. Pre op HGA1C 5.3 ?6. Will discuss with Dr. Prescott Gum, but likely remove EPW and chest tubes ?7. LOC constipation ?8. Hopefully, home tomorrow ? ?Donielle M ZimmermanPA-C ?7:07 AM ?  Agree with plan ?Leave chest tube sutures and remove later in office ? ?patient examined and medical record reviewed,agree with above note. ?Dahlia Byes ?06/13/2021  ?

## 2021-06-14 ENCOUNTER — Inpatient Hospital Stay (HOSPITAL_COMMUNITY): Payer: 59

## 2021-06-14 MED ORDER — FUROSEMIDE 40 MG PO TABS: 40.0000 mg | ORAL_TABLET | Freq: Every day | ORAL | 0 refills | Status: DC

## 2021-06-14 MED ORDER — POTASSIUM CHLORIDE CRYS ER 20 MEQ PO TBCR: 20.0000 meq | EXTENDED_RELEASE_TABLET | Freq: Every day | ORAL | 0 refills | Status: DC

## 2021-06-14 MED ORDER — METOPROLOL TARTRATE 25 MG PO TABS: 12.5000 mg | ORAL_TABLET | Freq: Two times a day (BID) | ORAL | 1 refills | Status: DC

## 2021-06-14 MED ORDER — POTASSIUM CHLORIDE CRYS ER 20 MEQ PO TBCR
20.0000 meq | EXTENDED_RELEASE_TABLET | Freq: Two times a day (BID) | ORAL | 0 refills | Status: DC
Start: 2021-06-14 — End: 2021-06-14

## 2021-06-14 MED ORDER — EZETIMIBE 10 MG PO TABS: 10.0000 mg | ORAL_TABLET | Freq: Every day | ORAL | 1 refills | Status: DC

## 2021-06-14 MED ORDER — OXYCODONE HCL 5 MG PO TABS: 5.0000 mg | ORAL_TABLET | Freq: Four times a day (QID) | ORAL | 0 refills | Status: DC | PRN

## 2021-06-14 NOTE — Progress Notes (Addendum)
? ?   ?  [address] ?      [place] [address] ?            [phone]   ? ?  ? ? ?5 Days Post-Op Procedure(s) (LRB): ?CORONARY ARTERY BYPASS GRAFTING X3 USING LEFT INTERNAL MAMMORY ARTERY AND RIGHT GREATER SAPHENOUS VEIN HARVESTED ENDOSCOPICALLY (N/A) ?TRANSESOPHAGEAL ECHOCARDIOGRAM (TEE) (N/A) ? ?Subjective: ?Wife at bedside. Patient had a bowel movement yesterday. He has no complaints this am and hopes to go home. ? ?Objective: ?Vital signs in last 24 hours: ?Temp:  [98.2 ?F (36.8 ?C)-99.1 ?F (37.3 ?C)] 98.3 ?F (36.8 ?C) (04/15 [id number]) ?Pulse Rate:  [72-87] 72 (04/15 0438) ?Cardiac Rhythm: Normal sinus rhythm (04/14 2330) ?Resp:  [16-22] 20 (04/15 0438) ?BP: (95-124)/(55-72) 103/60 (04/15 0438) ?SpO2:  [90 %-100 %] 96 % (04/15 0438) ?Weight:  [89.1 kg] 89.1 kg (04/15 0441) ? ?Pre op weight 87.5 kg ?Current Weight  ?06/14/21 89.1 kg  ? ?  ? ?Intake/Output from previous day: ?04/14 0701 - 04/15 0700 ?In: 5 [P.O.:830] ?Out: 900 [Urine:900] ? ? ?Physical Exam: ? ?Cardiovascular: RRR ?Pulmonary: Diminished throughout but clear ?Abdomen: Soft, non tender, bowel sounds present. ?Extremities: Mild bilateral lower extremity edema. ?Wounds: Clean and dry.  No erythema or signs of infection. ? ?Lab Results: ?CBC: ?Recent Labs  ?  06/12/21 ?0208  ?WBC 12.8*  ?HGB 10.4*  ?HCT 31.0*  ?PLT 192  ? ? ?BMET:  ?Recent Labs  ?  06/12/21 ?0208  ?NA 139  ?K 4.2  ?CL 108  ?CO2 25  ?GLUCOSE 101*  ?BUN 15  ?CREATININE 0.98  ?CALCIUM 8.5*  ? ?  ?PT/INR:  ?Lab Results  ?Component Value Date  ? INR 1.3 (H) 06/09/2021  ? INR 1.0 06/05/2021  ? ?ABG:  ?INR: ?Will add last result for INR, ABG once components are confirmed ?Will add last 4 CBG results once components are confirmed ? ?Assessment/Plan: ? ?1. CV - SR with HR in the 80's, occasional PVCs. On Lopressor 12.5 mg bid (pre op dose). ?2.  Pulmonary - On room air. CXR this am appears stable. Encourage incentive spirometer. ?3. Volume Overload - On Lasix 40 mg daily. ?4.   Expected post op acute blood loss anemia - Last H and H stable at 10.4 and 31. ?5. Discharge;chest tube sutures to remain as will be removed in the office after discharge ? ?Jayen Bromwell M ZimmermanPA-C ?7:34 AM ?   ?

## 2021-06-14 NOTE — Progress Notes (Signed)
PT being d/c, education complete, sternal precautions reviewed, IV removed, Tele returned.  ? ?Balinda Quails, RN ?06/14/2021 ?9:58 AM  ?

## 2021-06-14 NOTE — Progress Notes (Signed)
Post-op education completed with patient and wife re: activity restrictions, exercise progression, signs of infection, daily weights, when to call the surgeon, referred to phase II cardiac rehab @ [facility].  Patient and wife very receptive, verbalized understanding. ?[phone] ?

## 2021-06-14 NOTE — TOC Transition Note (Signed)
Transition of Care (TOC) - CM/SW Discharge Note ? ? ?Patient Details  ?Name: Nathan Hammond ?MRN: [id number] ?Date of Birth: 28-Oct-1965 ? ?Transition of Care (TOC) CM/SW Contact:  ?Konrad Penta, RN ?Phone Number: [phone] ?06/14/2021, 10:12 AM ? ? ?Clinical Narrative:   Spoke with [name] regarding DME needs. He will need RW. Reports he lives with spouse and his mother. No further TOC needs identified. Discussed RW will be delivered to room prior to discharge ? ? ? ?Final next level of care: Home/Self Care ?Barriers to Discharge: No Barriers Identified ? ? ?Patient Goals and CMS Choice ?Patient states their goals for this hospitalization and ongoing recovery are:: return home ?CMS Medicare.gov Compare Post Acute Care list provided to:: Patient ?Choice offered to / list presented to : Patient ? ?Discharge Placement ?  ?           ?  ?  ?  ?  ? ?Discharge Plan and Services ?  ?  ?           ?DME Arranged: Walker rolling ?DME Agency: [employer] ?Date DME Agency Contacted: 06/14/21 ?Time DME Agency Contacted: [phone] ?Representative spoke with at DME Agency: Delana Meyer ?  ?  ?  ?  ?  ? ?Social Determinants of Health (SDOH) Interventions ?  ? ? ?Readmission Risk Interventions ?   ? View : No data to display.  ?  ?  ?  ? ? ? ? ? ?

## 2021-06-16 ENCOUNTER — Other Ambulatory Visit: Payer: Self-pay | Admitting: *Deleted

## 2021-06-16 DIAGNOSIS — Z951 Presence of aortocoronary bypass graft: Secondary | ICD-10-CM

## 2021-06-19 ENCOUNTER — Encounter (INDEPENDENT_AMBULATORY_CARE_PROVIDER_SITE_OTHER): Payer: Self-pay

## 2021-06-19 DIAGNOSIS — Z4802 Encounter for removal of sutures: Secondary | ICD-10-CM

## 2021-07-04 ENCOUNTER — Ambulatory Visit
Admission: RE | Admit: 2021-07-04 | Discharge: 2021-07-04 | Disposition: A | Discharge status: Home / Self Care | Payer: 59 | Source: Ambulatory Visit | Attending: Cardiothoracic Surgery | Admitting: Cardiothoracic Surgery

## 2021-07-04 ENCOUNTER — Ambulatory Visit (INDEPENDENT_AMBULATORY_CARE_PROVIDER_SITE_OTHER): Payer: Self-pay | Admitting: Cardiothoracic Surgery

## 2021-07-04 ENCOUNTER — Other Ambulatory Visit: Payer: Self-pay | Admitting: Cardiothoracic Surgery

## 2021-07-04 ENCOUNTER — Encounter: Payer: Self-pay | Admitting: Cardiothoracic Surgery

## 2021-07-04 VITALS — BP 110/78 | HR 64 | Resp 20 | Ht 68.0 in | Wt 194.0 lb

## 2021-07-04 DIAGNOSIS — Z951 Presence of aortocoronary bypass graft: Secondary | ICD-10-CM

## 2021-07-04 DIAGNOSIS — I2510 Atherosclerotic heart disease of native coronary artery without angina pectoris: Secondary | ICD-10-CM

## 2021-07-04 NOTE — Progress Notes (Signed)
?HPI: ? ?Patient returns for routine postoperative follow-up having undergone multivessel CABG for accelerating angina on June 09, 2021. ?The patient's early postoperative recovery while in the hospital was uncomplicated and she maintained sinus rhythm. ?Since hospital discharge the patient reports overall progressive improvement in exercise tolerance and wellbeing.  Patient is walking 15 minutes twice a day and will increase to 30 minutes daily next week.  No recurrent angina.  Surgical incisions healing well.  He is off narcotics. ? ?Patient had a chest x-ray today which I reviewed and shows clear lung fields, no pleural effusion.  Sternal wires intact and well aligned. ? ? ? ?Current Outpatient Medications  ?Medication Sig Dispense Refill  ? Ascorbic Acid (VITAMIN C) 1000 MG tablet Take 1,000 mg by mouth daily.    ? aspirin EC 325 MG EC tablet Take 1 tablet (325 mg total) by mouth daily. 30 tablet 0  ? Coenzyme Q10 (COQ-10) 100 MG CAPS Take 100 mg by mouth daily.    ? ezetimibe (ZETIA) 10 MG tablet Take 1 tablet (10 mg total) by mouth daily. 30 tablet 1  ? Menaquinone-7 (K2 PO) Take 1 tablet by mouth daily.    ? metoprolol tartrate (LOPRESSOR) 25 MG tablet Take 0.5 tablets (12.5 mg total) by mouth 2 (two) times daily. 30 tablet 1  ? Multiple Vitamin (MULTIVITAMIN WITH MINERALS) TABS tablet Take 1 tablet by mouth daily.    ? OMEGA-3 FATTY ACIDS PO Take 1 capsule by mouth in the morning.    ? oxyCODONE (OXY IR/ROXICODONE) 5 MG immediate release tablet Take 1 tablet (5 mg total) by mouth every 6 (six) hours as needed for severe pain. 30 tablet 0  ? rosuvastatin (CRESTOR) 40 MG tablet Take 1 tablet (40 mg total) by mouth daily. 30 tablet 3  ? ALPRAZolam (XANAX) 1 MG tablet Take 1 tablet (1 mg total) by mouth at bedtime as needed for anxiety. 30 tablet 0  ? furosemide (LASIX) 40 MG tablet Take 1 tablet (40 mg total) by mouth daily. For 5 days then stop. 5 tablet 0  ? isosorbide mononitrate (IMDUR) 30 MG 24 hr tablet  Take 15 mg by mouth daily.    ? potassium chloride SA (KLOR-CON M) 20 MEQ tablet Take 1 tablet (20 mEq total) by mouth daily. For 5 days then stop. 5 tablet 0  ? ?No current facility-administered medications for this visit.  ? Blood pressure 110/78, pulse 64, resp. rate 20, height 5\' 8"  (1.727 m), weight 194 lb (88 kg), SpO2 98 %.  ? ?Physical Exam ?  ?   Exam ? ?  General- alert and comfortable.  Sternal incision well-healed. ?   Neck- no JVD, no cervical adenopathy palpable, no carotid bruit ?  Lungs- clear without rales, wheezes ?  Cor- regular rate and rhythm, no murmur , gallop ?  Abdomen- soft, non-tender ?  Extremities - warm, non-tender, minimal edema ?  Neuro- oriented, appropriate, no focal weakness ? ?Diagnostic Tests: ?Chest x-ray reviewed as noted above and is clear. ? ?Impression: ?Patient is doing well and is approximately 50% recovered from surgery.  He may now drive and lift up to pounds.  He will continue his current medications and heart healthy diet. ? ?Patient is not ready to return to work and due to his the heavy lifting requirements of his job the proper sternal healing will probably require until September before he can return to work. ?Plan: ?Return for review of progress and to discuss further lifting of  restrictions in 8 weeks. ? ?Lovett Sox, MD ?[facility] ?[phone] ? ? ? ?

## 2021-07-07 ENCOUNTER — Ambulatory Visit: Payer: 59 | Admitting: Cardiothoracic Surgery

## 2021-07-08 ENCOUNTER — Encounter: Payer: Self-pay | Admitting: Nurse Practitioner

## 2021-07-08 ENCOUNTER — Ambulatory Visit (INDEPENDENT_AMBULATORY_CARE_PROVIDER_SITE_OTHER): Payer: 59 | Admitting: Nurse Practitioner

## 2021-07-08 VITALS — BP 100/60 | HR 72 | Ht 68.0 in | Wt 196.5 lb

## 2021-07-08 DIAGNOSIS — I2510 Atherosclerotic heart disease of native coronary artery without angina pectoris: Secondary | ICD-10-CM

## 2021-07-08 DIAGNOSIS — I10 Essential (primary) hypertension: Secondary | ICD-10-CM | POA: Diagnosis not present

## 2021-07-08 DIAGNOSIS — E785 Hyperlipidemia, unspecified: Secondary | ICD-10-CM | POA: Diagnosis not present

## 2021-07-08 DIAGNOSIS — Z79899 Other long term (current) drug therapy: Secondary | ICD-10-CM | POA: Diagnosis not present

## 2021-07-08 MED ORDER — EZETIMIBE 10 MG PO TABS: 10.0000 mg | ORAL_TABLET | Freq: Every day | ORAL | 3 refills | Status: DC

## 2021-07-08 NOTE — Progress Notes (Signed)
? ? ?Office Visit  ?  ?Patient Name: Nathan Hammond ?Date of Encounter: 07/08/2021 ? ?Primary Care Provider:  Cleon Dew, FNP ?Primary Cardiologist:  Debbe Odea, MD ? ?Chief Complaint  ?  ?56 y/o ? w/ a h/o HTN, HL, and recent finding of multivessel coronary artery disease, who presents for follow-up after coronary artery bypass grafting in April 2023. ? ?Past Medical History  ?  ?Past Medical History:  ?Diagnosis Date  ? Coronary artery disease   ? a. 04/2021 Cath: LM nl, LAD 30ost/p, 90p/m, 42m, LCX 56m/d, OM1/2/3 nl, RCA 159m, RPDA fills via L->R collats from dLAD, EF 55-65%; b. 05/2021 CABG x 3: LIMA->LAD, VG->OM, VG->RCA.  ? Essential hypertension   ? History of echocardiogram   ? a. 04/2021 Echo: EF 60-65%, no rwma, nl RV fxn.  ? Hyperlipidemia LDL goal <70   ? ?Past Surgical History:  ?Procedure Laterality Date  ? CORONARY ARTERY BYPASS GRAFT N/A 06/09/2021  ? Procedure: CORONARY ARTERY BYPASS GRAFTING X3 USING LEFT INTERNAL MAMMORY ARTERY AND RIGHT GREATER SAPHENOUS VEIN HARVESTED ENDOSCOPICALLY;  Surgeon: Lovett Sox, MD;  Location: [facility] OR;  Service: Open Heart Surgery;  Laterality: N/A;  ? LEFT HEART CATH AND CORONARY ANGIOGRAPHY N/A 05/07/2021  ? Procedure: LEFT HEART CATH AND CORONARY ANGIOGRAPHY;  Surgeon: Iran Ouch, MD;  Location: [facility] INVASIVE CV LAB;  Service: Cardiovascular;  Laterality: N/A;  ? TEE WITHOUT CARDIOVERSION N/A 06/09/2021  ? Procedure: TRANSESOPHAGEAL ECHOCARDIOGRAM (TEE);  Surgeon: Lovett Sox, MD;  Location: [facility] OR;  Service: Open Heart Surgery;  Laterality: N/A;  ? ? ?Allergies ? ?Allergies  ?Allergen Reactions  ? Morphine   ?  Pt states he doesn't want morphine, no reaction documented   ? ? ?History of Present Illness  ?  ?56 year old male with above past medical history including hypertension, hyperlipidemia, and coronary artery disease.  In February 2023, he underwent CT calcium scoring which showed a total calcium score of 775 (LAD 666, left circumflex 109).   He was subsequently seen in the emergency department in late February 2023, in the setting of chest pain and anxiety.  Troponins were normal.  He established care with Dr. Azucena Cecil on February 28 and subsequently underwent echocardiogram, which showed normal LV function without any significant valvular disease.  Diagnostic catheterization was undertaken and showed severe multivessel CAD including a 90% proximal/mid LAD stenosis, 80% mid/distal left circumflex stenosis, and an occluded mid RCA.  EF was 55 to 65%.  He was referred to CT surgery and subsequently presented on April 10 for coronary artery bypass grafting x3 (LIMA to the LAD, vein graft to the obtuse marginal, vein graft to the RCA).  Postoperative course was relatively uncomplicated and he was discharged home on April 15.  He was recently seen by CT surgery on May 5, and was noted to be doing well. ? ?Mr. Nathan Hammond continues to do well.  He has slowly increasing his activity and is walking about 20 to 30 minutes a day.  He has only mild chest wall discomfort but denies angina, dyspnea, or lower extremity edema.  He has been tolerating rosuvastatin and Zetia well (previously did not tolerate simvastatin).  He is looking forward to enrolling in cardiac rehabilitation.  He denies palpitations, PND, orthopnea, dizziness, syncope, or early satiety. ? ?Home Medications  ?  ?Current Outpatient Medications  ?Medication Sig Dispense Refill  ? Ascorbic Acid (VITAMIN C) 1000 MG tablet Take 1,000 mg by mouth daily.    ? aspirin EC 325 MG EC  tablet Take 1 tablet (325 mg total) by mouth daily. 30 tablet 0  ? Coenzyme Q10 (COQ-10) 100 MG CAPS Take 100 mg by mouth daily.    ? ezetimibe (ZETIA) 10 MG tablet Take 1 tablet (10 mg total) by mouth daily. 30 tablet 1  ? Menaquinone-7 (K2 PO) Take 1 tablet by mouth daily.    ? metoprolol tartrate (LOPRESSOR) 25 MG tablet Take 0.5 tablets (12.5 mg total) by mouth 2 (two) times daily. 30 tablet 1  ? Multiple Vitamin  (MULTIVITAMIN WITH MINERALS) TABS tablet Take 1 tablet by mouth daily.    ? OMEGA-3 FATTY ACIDS PO Take 1 capsule by mouth in the morning.    ? rosuvastatin (CRESTOR) 40 MG tablet Take 1 tablet (40 mg total) by mouth daily. 30 tablet 3  ? ?No current facility-administered medications for this visit.  ?  ? ?Review of Systems  ?  ?Only mild chest wall tenderness/soreness.  He denies angina, dyspnea, palpitations, PND, orthopnea, dizziness, syncope, edema, or early satiety.  All other systems reviewed and are otherwise negative except as noted above. ? ? ?Cardiac Rehabilitation Eligibility Assessment  ?   ? ?Physical Exam  ?  ?VS:  BP 100/60 (BP Location: Left Arm, Patient Position: Sitting, Cuff Size: Normal)   Pulse 72   Ht 5\' 8"  (1.727 m)   Wt 196 lb 8 oz (89.1 kg)   SpO2 96%   BMI 29.88 kg/m?  , BMI Body mass index is 29.88 kg/m?. ?    ?GEN: Well nourished, well developed, in no acute distress. ?HEENT: normal. ?Neck: Supple, no JVD, carotid bruits, or masses. ?Cardiac: RRR, no murmurs, rubs, or gallops. No clubbing, cyanosis, edema.  Radials/PT 2+ and equal bilaterally.  Midsternal incision healing well without drainage or erythema. ?Respiratory:  Respirations regular and unlabored, clear to auscultation bilaterally. ?GI: Soft, nontender, nondistended, BS + x 4.  Drain wounds healing well. ?MS: no deformity or atrophy. ?Skin: warm and dry, no rash. ?Neuro:  Strength and sensation are intact. ?Psych: Normal affect. ? ?Accessory Clinical Findings  ?  ?ECG personally reviewed by me today -regular sinus rhythm, 72, nonspecific T changes- no acute changes. ? ?Lab Results  ?Component Value Date  ? WBC 12.8 (H) 06/12/2021  ? HGB 10.4 (L) 06/12/2021  ? HCT 31.0 (L) 06/12/2021  ? MCV 93.7 06/12/2021  ? PLT 192 06/12/2021  ? ?Lab Results  ?Component Value Date  ? CREATININE 0.98 06/12/2021  ? BUN 15 06/12/2021  ? NA 139 06/12/2021  ? K 4.2 06/12/2021  ? CL 108 06/12/2021  ? CO2 25 06/12/2021  ? ?Lab Results  ?Component  Value Date  ? ALT 25 06/05/2021  ? AST 23 06/05/2021  ? ALKPHOS 40 06/05/2021  ? BILITOT 0.9 06/05/2021  ? ?Lab Results  ?Component Value Date  ? CHOL 154 05/21/2021  ? HDL 66 05/21/2021  ? LDLCALC 77 05/21/2021  ? TRIG 54 05/21/2021  ? CHOLHDL 2.3 05/21/2021  ?  ?Lab Results  ?Component Value Date  ? HGBA1C 5.3 06/05/2021  ? ? ?Assessment & Plan  ?  ?1.  Coronary artery disease: Status post CABG x3 on April 10, with relatively uneventful post Hospital course.  He has been doing well at home with minimal chest wall soreness and no angina or dyspnea.  Surgical wounds have been healing well and he is without edema.  He has been tolerating high potency statin and Zetia therapy.  He remains on aspirin and beta-blocker therapy.  He is  looking forward to enrolling in cardiac rehabilitation once cleared by CT surgery. ? ?2.  Essential hypertension: Blood pressure is well controlled if anything soft at 100/60.  He is asymptomatic and remains on low-dose beta-blocker therapy. ? ?3.  Hyperlipidemia: LDL of 77 in March.  He is now on high potency statin and Zetia therapy.  We will arrange for follow-up lipids and LFTs when fasting, which she has not today. ? ?4.  Disposition: Follow-up fasting lipids and LFTs.  Follow-up in clinic in 4 to 6 weeks or sooner if necessary. ? ? ?Nicolasa Ducking, NP ?07/08/2021, 8:22 AM ? ?

## 2021-07-08 NOTE — Patient Instructions (Signed)
Medication Instructions:  ?- Your physician recommends that you continue on your current medications as directed. Please refer to the Current Medication list given to you today. ? ?*If you need a refill on your cardiac medications before your next appointment, please call your pharmacy* ? ? ?Lab Work: ?- Your physician recommends that you return for FASTING lab work at your convenience: Lipid/ Liver ? ?Nothing to eat or drink for 8 hours prior to your lab draw except for water or black coffee ? ?[facility] Entrance at [facility] ?1st desk on the right to check in (REGISTRATION) ?Lab hours: Monday- Friday (7:30 am- 5:30 pm) ? ? ?If you have labs (blood work) drawn today and your tests are completely normal, you will receive your results only by: ?MyChart Message (if you have MyChart) OR ?A paper copy in the mail ?If you have any lab test that is abnormal or we need to change your treatment, we will call you to review the results. ? ? ?Testing/Procedures: ?- none ordered ? ? ?Follow-Up: ?At [facility], you and your health needs are our priority.  As part of our continuing mission to provide you with exceptional heart care, we have created designated Provider Care Teams.  These Care Teams include your primary Cardiologist (physician) and Advanced Practice Providers (APPs -  Physician Assistants and Nurse Practitioners) who all work together to provide you with the care you need, when you need it. ? ?We recommend signing up for the patient portal called "MyChart".  Sign up information is provided on this After Visit Summary.  MyChart is used to connect with patients for Virtual Visits (Telemedicine).  Patients are able to view lab/test results, encounter notes, upcoming appointments, etc.  Non-urgent messages can be sent to your provider as well.   ?To learn more about what you can do with MyChart, go to [email or web address].   ? ?Your next appointment:   ?4-6 week(s) ? ?The format for your next appointment:   ?In  Person ? ?Provider:   ?You may see Debbe Odea, MD or one of the following Advanced Practice Providers on your designated Care Team:   ?Nicolasa Ducking, NP ? ? ? ?Other Instructions ?N/a ? ?Important Information About Sugar ? ? ? ? ? ? ?

## 2021-07-10 ENCOUNTER — Other Ambulatory Visit
Admission: RE | Admit: 2021-07-10 | Discharge: 2021-07-10 | Disposition: A | Discharge status: Home / Self Care | Payer: 59 | Attending: Nurse Practitioner | Admitting: Nurse Practitioner

## 2021-07-10 DIAGNOSIS — I2510 Atherosclerotic heart disease of native coronary artery without angina pectoris: Secondary | ICD-10-CM | POA: Insufficient documentation

## 2021-07-10 DIAGNOSIS — E785 Hyperlipidemia, unspecified: Secondary | ICD-10-CM | POA: Insufficient documentation

## 2021-07-10 DIAGNOSIS — Z79899 Other long term (current) drug therapy: Secondary | ICD-10-CM | POA: Insufficient documentation

## 2021-07-10 LAB — HEPATIC FUNCTION PANEL
ALT: 21 U/L (ref 0–44)
AST: 21 U/L (ref 15–41)
Albumin: 4.6 g/dL (ref 3.5–5.0)
Alkaline Phosphatase: 52 U/L (ref 38–126)
Bilirubin, Direct: 0.1 mg/dL (ref 0.0–0.2)
Indirect Bilirubin: 0.7 mg/dL (ref 0.3–0.9)
Total Bilirubin: 0.8 mg/dL (ref 0.3–1.2)
Total Protein: 7.8 g/dL (ref 6.5–8.1)

## 2021-07-10 LAB — LIPID PANEL
Cholesterol: 108 mg/dL (ref 0–200)
HDL: 50 mg/dL (ref >40)
LDL Cholesterol: 47 mg/dL (ref 0–99)
Total CHOL/HDL Ratio: 2.2 RATIO
Triglycerides: 57 mg/dL (ref <150)
VLDL: 11 mg/dL (ref 0–40)

## 2021-07-18 ENCOUNTER — Encounter: Payer: 59 | Attending: Cardiology | Admitting: *Deleted

## 2021-07-18 DIAGNOSIS — Z48812 Encounter for surgical aftercare following surgery on the circulatory system: Secondary | ICD-10-CM | POA: Insufficient documentation

## 2021-07-18 DIAGNOSIS — Z951 Presence of aortocoronary bypass graft: Secondary | ICD-10-CM | POA: Insufficient documentation

## 2021-07-18 NOTE — Progress Notes (Signed)
Virtual orientation call completed today. he has an appointment on Date: 07/29/2021  for EP eval and gym Orientation.  Documentation of diagnosis can be found in [facility] Date: 06/09/2021 .

## 2021-07-29 VITALS — Ht 69.0 in | Wt 196.4 lb

## 2021-07-29 DIAGNOSIS — Z48812 Encounter for surgical aftercare following surgery on the circulatory system: Secondary | ICD-10-CM | POA: Diagnosis present

## 2021-07-29 DIAGNOSIS — Z951 Presence of aortocoronary bypass graft: Secondary | ICD-10-CM | POA: Diagnosis not present

## 2021-07-29 NOTE — Progress Notes (Signed)
Cardiac Individual Treatment Plan  Patient Details  Name: Nathan Hammond MRN: [id number] Date of Birth: 11/14/1965 Referring Provider:   Flowsheet Row Cardiac Rehab from 07/29/2021 in [facility] Cardiac and Pulmonary Rehab  Referring Provider Debbe Odea MD       Initial Encounter Date:  Flowsheet Row Cardiac Rehab from 07/29/2021 in [facility] Cardiac and Pulmonary Rehab  Date 07/29/21       Visit Diagnosis: S/P CABG x 3  Patient's Home Medications on Admission:  Current Outpatient Medications:    Ascorbic Acid (VITAMIN C) 1000 MG tablet, Take 1,000 mg by mouth daily., Disp: , Rfl:    aspirin EC 325 MG EC tablet, Take 1 tablet (325 mg total) by mouth daily., Disp: 30 tablet, Rfl: 0   Coenzyme Q10 (COQ-10) 100 MG CAPS, Take 100 mg by mouth daily., Disp: , Rfl:    ezetimibe (ZETIA) 10 MG tablet, Take 1 tablet (10 mg total) by mouth daily., Disp: 90 tablet, Rfl: 3   Menaquinone-7 (K2 PO), Take 1 tablet by mouth daily., Disp: , Rfl:    metoprolol tartrate (LOPRESSOR) 25 MG tablet, Take 0.5 tablets (12.5 mg total) by mouth 2 (two) times daily., Disp: 30 tablet, Rfl: 1   Multiple Vitamin (MULTIVITAMIN WITH MINERALS) TABS tablet, Take 1 tablet by mouth daily., Disp: , Rfl:    OMEGA-3 FATTY ACIDS PO, Take 1 capsule by mouth in the morning., Disp: , Rfl:    rosuvastatin (CRESTOR) 40 MG tablet, Take 1 tablet (40 mg total) by mouth daily., Disp: 30 tablet, Rfl: 3  Past Medical History: Past Medical History:  Diagnosis Date   Coronary artery disease    a. 04/2021 Cath: LM nl, LAD 30ost/p, 90p/m, 53m, LCX 58m/d, OM1/2/3 nl, RCA 116m, RPDA fills via L->R collats from dLAD, EF 55-65%; b. 05/2021 CABG x 3: LIMA->LAD, VG->OM, VG->RCA.   Essential hypertension    History of echocardiogram    a. 04/2021 Echo: EF 60-65%, no rwma, nl RV fxn.   Hyperlipidemia LDL goal <70     Tobacco Use: Social History   Tobacco Use  Smoking Status Never  Smokeless Tobacco Never    Labs: Review Flowsheet         Latest Ref Rng & Units 05/21/2021 06/05/2021 06/09/2021 07/10/2021  Labs for ITP Cardiac and Pulmonary Rehab  Cholestrol 0 - 200 mg/dL 409     811    LDL (calc) 0 - 99 mg/dL 77     47    HDL-C >91 mg/dL 66     50    Trlycerides <150 mg/dL 54     57    Hemoglobin A1c 4.8 - 5.6 %  5.3      PH, Arterial 7.35 - 7.45  7.51   7.344     7.340     7.369     7.371     7.396     7.351     PCO2 arterial 32 - 48 mmHg  32   39.9     40.7     45.3     41.3     41.6     46.4     Bicarbonate 20.0 - 28.0 mmol/L  25.5   21.8     22.3     26.6     23.9     24.2     25.5     25.7     TCO2 22 - 32 mmol/L   23     24  28     25     25     26     26     25     27     24     25     27      Acid-base deficit 0.0 - 2.0 mmol/L   4.0     4.0     1.0     1.0     O2 Saturation %  97.1   98     99     99     100     79     100     100         Multiple values from one day are sorted in reverse-chronological order         Exercise Target Goals: Exercise Program Goal: Individual exercise prescription set using results from initial 6 min walk test and THRR while considering  patient's activity barriers and safety.   Exercise Prescription Goal: Initial exercise prescription builds to 30-45 minutes a day of aerobic activity, 2-3 days per week.  Home exercise guidelines will be given to patient during program as part of exercise prescription that the participant will acknowledge.   Education: Aerobic Exercise: - Group verbal and visual presentation on the components of exercise prescription. Introduces F.I.T.T principle from ACSM for exercise prescriptions.  Reviews F.I.T.T. principles of aerobic exercise including progression. Written material given at graduation.   Education: Resistance Exercise: - Group verbal and visual presentation on the components of exercise prescription. Introduces F.I.T.T principle from ACSM for exercise prescriptions  Reviews F.I.T.T. principles of  resistance exercise including progression. Written material given at graduation.    Education: Exercise & Equipment Safety: - Individual verbal instruction and demonstration of equipment use and safety with use of the equipment. Flowsheet Row Cardiac Rehab from [facility]29/20[facility] Cardiac [facility] Pul[facility]  Education need identified 07/29/21  Date 07/29/21  Educator KL  Instruction Review Code 1- Verbalizes Understanding       Education: Exercise Physiology & General Exercise Guidelines: - Group verbal and written instruction with models to review the exercise physiology of the cardiovascular system and associated critical values. Provides general exercise guidelines with specific guidelines to those with heart or lung disease.    Education: Flexibility, Balance, Mind/Body Relaxation: - Group verbal and visual presentation with interactive activity on the components of exercise prescription. Introduces F.I.T.T principle from ACSM for exercise prescriptions. Reviews F.I.T.T. principles of flexibility and balance exercise training including progression. Also discusses the mind body connection.  Reviews various relaxation techniques to help reduce and manage stress (i.e. Deep breathing, progressive muscle relaxation, and visualization). Balance handout provided to take home. Written material given at graduation.   Activity Barriers & Risk Stratification:  Activity Barriers & Cardiac Risk Stratification - 07/29/21 1533       Activity Barriers & Cardiac Risk Stratification   Activity Barriers Incisional Pain    Cardiac Risk Stratification High             6 Minute Walk:  6 Minute Walk     Row Name 07/29/21 1533         6 Minute Walk   Phase Initial     Distance 1415 feet     Walk Time 6 minutes     # of Rest Breaks 0     MPH 2.67     METS 3.52     RPE 7  Perceived Dyspnea  0     VO2 Peak 12.32     Symptoms No     Resting HR 63 bpm     Resting BP 108/64      Resting Oxygen Saturation  97 %     Exercise Oxygen Saturation  during 6 min walk 99 %     Max Ex. HR 81 bpm     Max Ex. BP 124/64     2 Minute Post BP 112/66              Oxygen Initial Assessment:   Oxygen Re-Evaluation:   Oxygen Discharge (Final Oxygen Re-Evaluation):   Initial Exercise Prescription:  Initial Exercise Prescription - 07/29/21 1500       Date of Initial Exercise RX and Referring Provider   Date 07/29/21    Referring Provider Debbe Odea MD      Oxygen   Maintain Oxygen Saturation 88% or higher      Treadmill   MPH 2.8    Grade 0.5    Minutes 15    METs 3.34      Recumbant Bike   Level 3    RPM 60    Watts 42    Minutes 15    METs 3.5      NuStep   Level 3    SPM 80    Minutes 15    METs 3.5      T5 Nustep   Level 2    SPM 80    Minutes 15    METs 3.5      Prescription Details   Frequency (times per week) 3    Duration Progress to 30 minutes of continuous aerobic without signs/symptoms of physical distress      Intensity   THRR 40-80% of Max Heartrate 103 - 143    Ratings of Perceived Exertion 11-13    Perceived Dyspnea 0-4      Progression   Progression Continue to progress workloads to maintain intensity without signs/symptoms of physical distress.      Resistance Training   Training Prescription Yes    Weight 3 lb    Reps 10-15             Perform Capillary Blood Glucose checks as needed.  Exercise Prescription Changes:   Exercise Prescription Changes     Row Name 07/29/21 1500             Response to Exercise   Blood Pressure (Admit) 108/64       Blood Pressure (Exercise) 124/64       Blood Pressure (Exit) 112/66       Heart Rate (Admit) 63 bpm       Heart Rate (Exercise) 81 bpm       Heart Rate (Exit) 61 bpm       Oxygen Saturation (Admit) 97 %       Oxygen Saturation (Exercise) 99 %       Rating of Perceived Exertion (Exercise) 7       Perceived Dyspnea (Exercise) 0       Symptoms none        Comments walk test results                Exercise Comments:   Exercise Goals and Review:   Exercise Goals     Row Name 07/29/21 1538             Exercise Goals   Increase Physical Activity  Yes       Intervention Provide advice, education, support and counseling about physical activity/exercise needs.;Develop an individualized exercise prescription for aerobic and resistive training based on initial evaluation findings, risk stratification, comorbidities and participant's personal goals.       Expected Outcomes Short Term: Attend rehab on a regular basis to increase amount of physical activity.;Long Term: Add in home exercise to make exercise part of routine and to increase amount of physical activity.;Long Term: Exercising regularly at least 3-5 days a week.       Increase Strength and Stamina Yes       Intervention Provide advice, education, support and counseling about physical activity/exercise needs.;Develop an individualized exercise prescription for aerobic and resistive training based on initial evaluation findings, risk stratification, comorbidities and participant's personal goals.       Expected Outcomes Short Term: Increase workloads from initial exercise prescription for resistance, speed, and METs.;Short Term: Perform resistance training exercises routinely during rehab and add in resistance training at home;Long Term: Improve cardiorespiratory fitness, muscular endurance and strength as measured by increased METs and functional capacity ( )       Able to understand and use rate of perceived exertion (RPE) scale Yes       Intervention Provide education and explanation on how to use RPE scale       Expected Outcomes Short Term: Able to use RPE daily in rehab to express subjective intensity level;Long Term:  Able to use RPE to guide intensity level when exercising independently       Able to understand and use Dyspnea scale Yes       Intervention Provide  education and explanation on how to use Dyspnea scale       Expected Outcomes Short Term: Able to use Dyspnea scale daily in rehab to express subjective sense of shortness of breath during exertion;Long Term: Able to use Dyspnea scale to guide intensity level when exercising independently       Knowledge and understanding of Target Heart Rate Range (THRR) Yes       Intervention Provide education and explanation of THRR including how the numbers were predicted and where they are located for reference       Expected Outcomes Short Term: Able to state/look up THRR;Long Term: Able to use THRR to govern intensity when exercising independently;Short Term: Able to use daily as guideline for intensity in rehab       Able to check pulse independently Yes       Intervention Provide education and demonstration on how to check pulse in carotid and radial arteries.;Review the importance of being able to check your own pulse for safety during independent exercise       Expected Outcomes Short Term: Able to explain why pulse checking is important during independent exercise;Long Term: Able to check pulse independently and accurately       Understanding of Exercise Prescription Yes       Intervention Provide education, explanation, and written materials on patient's individual exercise prescription       Expected Outcomes Short Term: Able to explain program exercise prescription;Long Term: Able to explain home exercise prescription to exercise independently                Exercise Goals Re-Evaluation :   Discharge Exercise Prescription (Final Exercise Prescription Changes):  Exercise Prescription Changes - 07/29/21 1500       Response to Exercise   Blood Pressure (Admit) 108/64    Blood Pressure (Exercise)  124/64    Blood Pressure (Exit) 112/66    Heart Rate (Admit) 63 bpm    Heart Rate (Exercise) 81 bpm    Heart Rate (Exit) 61 bpm    Oxygen Saturation (Admit) 97 %    Oxygen Saturation (Exercise) 99  %    Rating of Perceived Exertion (Exercise) 7    Perceived Dyspnea (Exercise) 0    Symptoms none    Comments walk test results             Nutrition:  Target Goals: Understanding of nutrition guidelines, daily intake of sodium 1500mg , cholesterol 200mg , calories 30% from fat and 7% or less from saturated fats, daily to have 5 or more servings of fruits and vegetables.  Education: All About Nutrition: -Group instruction provided by verbal, written material, interactive activities, discussions, models, and posters to present general guidelines for heart healthy nutrition including fat, fiber, MyPlate, the role of sodium in heart healthy nutrition, utilization of the nutrition label, and utilization of this knowledge for meal planning. Follow up email sent as well. Written material given at graduation. Flowsheet Row Cardiac Rehab from 07/29/2021 in [facility] Cardiac and Pulmonary Rehab  Education need identified 07/29/21       Biometrics:  Pre Biometrics - 07/29/21 1532       Pre Biometrics   Height  (1.753 m)    Weight 196 lb 6.4 oz (89.1 kg)    BMI (Calculated) 28.99    Single Leg Stand 30 seconds              Nutrition Therapy Plan and Nutrition Goals:  Nutrition Therapy & Goals - 07/29/21 1518       Personal Nutrition Goals   Comments Patient lost over 100 lb in the last year and had been using the Keto diet.      Intervention Plan   Intervention Prescribe, educate and counsel regarding individualized specific dietary modifications aiming towards targeted core components such as weight, hypertension, lipid management, diabetes, heart failure and other comorbidities.    Expected Outcomes Short Term Goal: Understand basic principles of dietary content, such as calories, fat, sodium, cholesterol and nutrients.;Short Term Goal: A plan has been developed with personal nutrition goals set during dietitian appointment.;Long Term Goal: Adherence to prescribed nutrition  plan.             Nutrition Assessments:  MEDIFICTS Score Key: ?70 Need to make dietary changes  40-70 Heart Healthy Diet ? 40 Therapeutic Level Cholesterol Diet  Flowsheet Row Cardiac Rehab from 07/29/2021 in [facility] Cardiac and Pulmonary Rehab  Picture Your Plate Total Score on Admission 51      Picture Your Plate Scores: <16 Unhealthy dietary pattern with much room for improvement. 41-50 Dietary pattern unlikely to meet recommendations for good health and room for improvement. 51-60 More healthful dietary pattern, with some room for improvement.  >60 Healthy dietary pattern, although there may be some specific behaviors that could be improved.    Nutrition Goals Re-Evaluation:   Nutrition Goals Discharge (Final Nutrition Goals Re-Evaluation):   Psychosocial: Target Goals: Acknowledge presence or absence of significant depression and/or stress, maximize coping skills, provide positive support system. Participant is able to verbalize types and ability to use techniques and skills needed for reducing stress and depression.   Education: Stress, Anxiety, and Depression - Group verbal and visual presentation to define topics covered.  Reviews how body is impacted by stress, anxiety, and depression.  Also discusses healthy ways to reduce stress and  to treat/manage anxiety and depression.  Written material given at graduation.   Education: Sleep Hygiene -Provides group verbal and written instruction about how sleep can affect your health.  Define sleep hygiene, discuss sleep cycles and impact of sleep habits. Review good sleep hygiene tips.    Initial Review & Psychosocial Screening:  Initial Psych Review & Screening - 07/18/21 1315       Initial Review   Current issues with None Identified      Family Dynamics   Good Support System? Yes   wife     Barriers   Psychosocial barriers to participate in program There are no identifiable barriers or psychosocial needs.;The  patient should benefit from training in stress management and relaxation.      Screening Interventions   Interventions Encouraged to exercise    Expected Outcomes Short Term goal: Utilizing psychosocial counselor, staff and physician to assist with identification of specific Stressors or current issues interfering with healing process. Setting desired goal for each stressor or current issue identified.;Long Term Goal: Stressors or current issues are controlled or eliminated.;Short Term goal: Identification and review with participant of any Quality of Life or Depression concerns found by scoring the questionnaire.;Long Term goal: The participant improves quality of Life and PHQ9 Scores as seen by post scores and/or verbalization of changes             Quality of Life Scores:   Quality of Life - 07/29/21 1515       Quality of Life   Select Quality of Life      Quality of Life Scores   Health/Function Pre 27.13 %    Socioeconomic Pre 25.56 %    Psych/Spiritual Pre 26.57 %    Family Pre 25.2 %    GLOBAL Pre 26.39 %            Scores of 19 and below usually indicate a poorer quality of life in these areas.  A difference of  2-3 points is a clinically meaningful difference.  A difference of 2-3 points in the total score of the Quality of Life Index has been associated with significant improvement in overall quality of life, self-image, physical symptoms, and general health in studies assessing change in quality of life.  PHQ-9: Review Flowsheet        07/29/2021  Depression screen PHQ 2/9  Decreased Interest 1  Down, Depressed, Hopeless 1  PHQ - 2 Score 2  Altered sleeping 2  Tired, decreased energy 1  Change in appetite 0  Feeling bad or failure about yourself  1  Trouble concentrating 0  Moving slowly or fidgety/restless 0  Suicidal thoughts 1  PHQ-9 Score 7  Difficult doing work/chores Somewhat difficult         Interpretation of Total Score  Total Score  Depression Severity:  1-4 = Minimal depression, 5-9 = Mild depression, 10-14 = Moderate depression, 15-19 = Moderately severe depression, 20-27 = Severe depression   Psychosocial Evaluation and Intervention:  Psychosocial Evaluation - 07/18/21 1335       Psychosocial Evaluation & Interventions   Comments Lukus has no barriers toattending the program. He lives wit his wife.She is is support. He has been on a weight loss journey since last May and has loss over 100 pounds to date. All by following  the KETOplan. His goal is to lose more weight and to get off his cholesterol medication. He is ready to start the program and continue to heal and learn more  about being heart healthy..    Expected Outcomes STG Attends all the scheduled sessions, attends education sessions too. LTG Continues to progress with his exercise and weight loss goals.             Psychosocial Re-Evaluation:   Psychosocial Discharge (Final Psychosocial Re-Evaluation):   Vocational Rehabilitation: Provide vocational rehab assistance to qualifying candidates.   Vocational Rehab Evaluation & Intervention:   Education: Education Goals: Education classes will be provided on a variety of topics geared toward better understanding of heart health and risk factor modification. Participant will state understanding/return demonstration of topics presented as noted by education test scores.  Learning Barriers/Preferences:   General Cardiac Education Topics:  AED/CPR: - Group verbal and written instruction with the use of models to demonstrate the basic use of the AED with the basic ABC's of resuscitation.   Anatomy and Cardiac Procedures: - Group verbal and visual presentation and models provide information about basic cardiac anatomy and function. Reviews the testing methods done to diagnose heart disease and the outcomes of the test results. Describes the treatment choices: Medical Management, Angioplasty, or  Coronary Bypass Surgery for treating various heart conditions including Myocardial Infarction, Angina, Valve Disease, and Cardiac Arrhythmias.  Written material given at graduation. Flowsheet Row Cardiac Rehab from 07/29/2021 in [facility] Cardiac and Pulmonary Rehab  Education need identified 07/29/21       Medication Safety: - Group verbal and visual instruction to review commonly prescribed medications for heart and lung disease. Reviews the medication, class of the drug, and side effects. Includes the steps to properly store meds and maintain the prescription regimen.  Written material given at graduation.   Intimacy: - Group verbal instruction through game format to discuss how heart and lung disease can affect sexual intimacy. Written material given at graduation..   Know Your Numbers and Heart Failure: - Group verbal and visual instruction to discuss disease risk factors for cardiac and pulmonary disease and treatment options.  Reviews associated critical values for Overweight/Obesity, Hypertension, Cholesterol, and Diabetes.  Discusses basics of heart failure: signs/symptoms and treatments.  Introduces Heart Failure Zone chart for action plan for heart failure.  Written material given at graduation. Flowsheet Row Cardiac Rehab from 07/29/2021 in [facility] Cardiac and Pulmonary Rehab  Education need identified 07/29/21  Date 07/29/21  Educator KL  Instruction Review Code 1- Verbalizes Understanding       Infection Prevention: - Provides verbal and written material to individual with discussion of infection control including proper hand washing and proper equipment cleaning during exercise session. Flowsheet Row Cardiac Rehab from 07/29/2021 in [facility] Cardiac and Pulmonary Rehab  Education need identified 07/29/21  Date 07/29/21  Educator KL  Instruction Review Code 1- Verbalizes Understanding       Falls Prevention: - Provides verbal and written material to individual with discussion of  falls prevention and safety. Flowsheet Row Cardiac Rehab from 07/29/2021 in [facility] Cardiac and Pulmonary Rehab  Education need identified 07/29/21  Date 07/29/21  Educator KL  Instruction Review Code 1- Verbalizes Understanding       Other: -Provides group and verbal instruction on various topics (see comments)   Knowledge Questionnaire Score:  Knowledge Questionnaire Score - 07/29/21 1516       Knowledge Questionnaire Score   Pre Score 21/16             Core Components/Risk Factors/Patient Goals at Admission:  Personal Goals and Risk Factors at Admission - 07/29/21 1538       Core  Components/Risk Factors/Patient Goals on Admission    Weight Management Yes;Weight Loss    Intervention Weight Management: Develop a combined nutrition and exercise program designed to reach desired caloric intake, while maintaining appropriate intake of nutrient and fiber, sodium and fats, and appropriate energy expenditure required for the weight goal.;Weight Management: Provide education and appropriate resources to help participant work on and attain dietary goals.;Weight Management/Obesity: Establish reasonable short term and long term weight goals.    Admit Weight 196 lb (88.9 kg)    Goal Weight: Short Term 190 lb (86.2 kg)    Goal Weight: Long Term 175 lb (79.4 kg)   Per patient. Currently following Keto the last year   Expected Outcomes Long Term: Adherence to nutrition and physical activity/exercise program aimed toward attainment of established weight goal;Short Term: Continue to assess and modify interventions until short term weight is achieved;Weight Loss: Understanding of general recommendations for a balanced deficit meal plan, which promotes 1-2 lb weight loss per week and includes a negative energy balance of [phone] kcal/d;Understanding recommendations for meals to include 15-35% energy as protein, 25-35% energy from fat, 35-60% energy from carbohydrates, less than  of dietary  cholesterol, 20-35 gm of total fiber daily;Understanding of distribution of calorie intake throughout the day with the consumption of 4-5 meals/snacks    Hypertension Yes    Intervention Provide education on lifestyle modifcations including regular physical activity/exercise, weight management, moderate sodium restriction and increased consumption of fresh fruit, vegetables, and low fat dairy, alcohol moderation, and smoking cessation.;Monitor prescription use compliance.    Expected Outcomes Short Term: Continued assessment and intervention until BP is < 140/32mm HG in hypertensive participants. < 130/50mm HG in hypertensive participants with diabetes, heart failure or chronic kidney disease.;Long Term: Maintenance of blood pressure at goal levels.    Lipids Yes    Intervention Provide education and support for participant on nutrition & aerobic/resistive exercise along with prescribed medications to achieve LDL 70mg , HDL >40mg .    Expected Outcomes Short Term: Participant states understanding of desired cholesterol values and is compliant with medications prescribed. Participant is following exercise prescription and nutrition guidelines.;Long Term: Cholesterol controlled with medications as prescribed, with individualized exercise RX and with personalized nutrition plan. Value goals: LDL < , HDL > 40 mg.             Education:Diabetes - Individual verbal and written instruction to review signs/symptoms of diabetes, desired ranges of glucose level fasting, after meals and with exercise. Acknowledge that pre and post exercise glucose checks will be done for 3 sessions at entry of program.   Core Components/Risk Factors/Patient Goals Review:    Core Components/Risk Factors/Patient Goals at Discharge (Final Review):    ITP Comments:  ITP Comments     Row Name 07/18/21 1338 07/29/21 1511         ITP Comments Virtual orientation call completed today. he has an appointment on Date:  07/29/2021  for EP eval and gym Orientation.  Documentation of diagnosis can be found in [facility] Date: 06/09/2021 . Completed and gym orientation. Initial ITP created and sent for review to Dr. Daniel Nones, Medical Director.               Comments: Initial ITP

## 2021-07-29 NOTE — Patient Instructions (Signed)
Patient Instructions  Patient Details  Name: Nathan Hammond MRN: [id number] Date of Birth: 17-Aug-1965 Referring Provider:  Kate Sable, MD  Below are your personal goals for exercise, nutrition, and risk factors. Our goal is to help you stay on track towards obtaining and maintaining these goals. We will be discussing your progress on these goals with you throughout the program.  Initial Exercise Prescription:  Initial Exercise Prescription - 07/29/21 1500       Date of Initial Exercise RX and Referring Provider   Date 07/29/21    Referring Provider Kate Sable MD      Oxygen   Maintain Oxygen Saturation 88% or higher      Treadmill   MPH 2.8    Grade 0.5    Minutes 15    METs 3.34      Recumbant Bike   Level 3    RPM 60    Watts 42    Minutes 15    METs 3.5      NuStep   Level 3    SPM 80    Minutes 15    METs 3.5      T5 Nustep   Level 2    SPM 80    Minutes 15    METs 3.5      Prescription Details   Frequency (times per week) 3    Duration Progress to 30 minutes of continuous aerobic without signs/symptoms of physical distress      Intensity   THRR 40-80% of Max Heartrate 103 - 143    Ratings of Perceived Exertion 11-13    Perceived Dyspnea 0-4      Progression   Progression Continue to progress workloads to maintain intensity without signs/symptoms of physical distress.      Resistance Training   Training Prescription Yes    Weight 3 lb    Reps 10-15             Exercise Goals: Frequency: Be able to perform aerobic exercise two to three times per week in program working toward 2-5 days per week of home exercise.  Intensity: Work with a perceived exertion of 11 (fairly light) - 15 (hard) while following your exercise prescription.  We will make changes to your prescription with you as you progress through the program.   Duration: Be able to do 30 to 45 minutes of continuous aerobic exercise in addition to a 5 minute warm-up and  a 5 minute cool-down routine.   Nutrition Goals: Your personal nutrition goals will be established when you do your nutrition analysis with the dietician.  The following are general nutrition guidelines to follow: Cholesterol < 200mg /day Sodium < 1500mg /day Fiber: Men over 50 yrs - 30 grams per day  Personal Goals:  Personal Goals and Risk Factors at Admission - 07/29/21 1538       Core Components/Risk Factors/Patient Goals on Admission    Weight Management Yes;Weight Loss    Intervention Weight Management: Develop a combined nutrition and exercise program designed to reach desired caloric intake, while maintaining appropriate intake of nutrient and fiber, sodium and fats, and appropriate energy expenditure required for the weight goal.;Weight Management: Provide education and appropriate resources to help participant work on and attain dietary goals.;Weight Management/Obesity: Establish reasonable short term and long term weight goals.    Admit Weight 196 lb (88.9 kg)    Goal Weight: Short Term 190 lb (86.2 kg)    Goal Weight: Long Term 175 lb (79.4 kg)  Per patient. Currently following Keto the last year   Expected Outcomes Long Term: Adherence to nutrition and physical activity/exercise program aimed toward attainment of established weight goal;Short Term: Continue to assess and modify interventions until short term weight is achieved;Weight Loss: Understanding of general recommendations for a balanced deficit meal plan, which promotes 1-2 lb weight loss per week and includes a negative energy balance of [phone] kcal/d;Understanding recommendations for meals to include 15-35% energy as protein, 25-35% energy from fat, 35-60% energy from carbohydrates, less than 200mg  of dietary cholesterol, 20-35 gm of total fiber daily;Understanding of distribution of calorie intake throughout the day with the consumption of 4-5 meals/snacks    Hypertension Yes    Intervention Provide education on  lifestyle modifcations including regular physical activity/exercise, weight management, moderate sodium restriction and increased consumption of fresh fruit, vegetables, and low fat dairy, alcohol moderation, and smoking cessation.;Monitor prescription use compliance.    Expected Outcomes Short Term: Continued assessment and intervention until BP is < 140/58mm HG in hypertensive participants. < 130/43mm HG in hypertensive participants with diabetes, heart failure or chronic kidney disease.;Long Term: Maintenance of blood pressure at goal levels.    Lipids Yes    Intervention Provide education and support for participant on nutrition & aerobic/resistive exercise along with prescribed medications to achieve LDL 70mg , HDL >40mg .    Expected Outcomes Short Term: Participant states understanding of desired cholesterol values and is compliant with medications prescribed. Participant is following exercise prescription and nutrition guidelines.;Long Term: Cholesterol controlled with medications as prescribed, with individualized exercise RX and with personalized nutrition plan. Value goals: LDL < 70mg , HDL > 40 mg.             Tobacco Use Initial Evaluation: Social History   Tobacco Use  Smoking Status Never  Smokeless Tobacco Never    Exercise Goals and Review:  Exercise Goals     Row Name 07/29/21 1538             Exercise Goals   Increase Physical Activity Yes       Intervention Provide advice, education, support and counseling about physical activity/exercise needs.;Develop an individualized exercise prescription for aerobic and resistive training based on initial evaluation findings, risk stratification, comorbidities and participant's personal goals.       Expected Outcomes Short Term: Attend rehab on a regular basis to increase amount of physical activity.;Long Term: Add in home exercise to make exercise part of routine and to increase amount of physical activity.;Long Term:  Exercising regularly at least 3-5 days a week.       Increase Strength and Stamina Yes       Intervention Provide advice, education, support and counseling about physical activity/exercise needs.;Develop an individualized exercise prescription for aerobic and resistive training based on initial evaluation findings, risk stratification, comorbidities and participant's personal goals.       Expected Outcomes Short Term: Increase workloads from initial exercise prescription for resistance, speed, and METs.;Short Term: Perform resistance training exercises routinely during rehab and add in resistance training at home;Long Term: Improve cardiorespiratory fitness, muscular endurance and strength as measured by increased METs and functional capacity (6MWT)       Able to understand and use rate of perceived exertion (RPE) scale Yes       Intervention Provide education and explanation on how to use RPE scale       Expected Outcomes Short Term: Able to use RPE daily in rehab to express subjective intensity level;Long Term:  Able to use  RPE to guide intensity level when exercising independently       Able to understand and use Dyspnea scale Yes       Intervention Provide education and explanation on how to use Dyspnea scale       Expected Outcomes Short Term: Able to use Dyspnea scale daily in rehab to express subjective sense of shortness of breath during exertion;Long Term: Able to use Dyspnea scale to guide intensity level when exercising independently       Knowledge and understanding of Target Heart Rate Range (THRR) Yes       Intervention Provide education and explanation of THRR including how the numbers were predicted and where they are located for reference       Expected Outcomes Short Term: Able to state/look up THRR;Long Term: Able to use THRR to govern intensity when exercising independently;Short Term: Able to use daily as guideline for intensity in rehab       Able to check pulse independently Yes        Intervention Provide education and demonstration on how to check pulse in carotid and radial arteries.;Review the importance of being able to check your own pulse for safety during independent exercise       Expected Outcomes Short Term: Able to explain why pulse checking is important during independent exercise;Long Term: Able to check pulse independently and accurately       Understanding of Exercise Prescription Yes       Intervention Provide education, explanation, and written materials on patient's individual exercise prescription       Expected Outcomes Short Term: Able to explain program exercise prescription;Long Term: Able to explain home exercise prescription to exercise independently                Copy of goals given to participant.

## 2021-08-04 ENCOUNTER — Encounter: Payer: 59 | Attending: Cardiology | Admitting: *Deleted

## 2021-08-04 DIAGNOSIS — Z951 Presence of aortocoronary bypass graft: Secondary | ICD-10-CM | POA: Insufficient documentation

## 2021-08-04 NOTE — Progress Notes (Signed)
Daily Session Note  Patient Details  Name: Nathan Hammond MRN: [id number] Date of Birth: [birth year] Referring Provider:   Flowsheet Row Cardiac Rehab from 07/29/2021 in [facility] Cardiac and Pulmonary Rehab  Referring Provider Kate Sable MD       Encounter Date: 08/04/2021  Check In:  Session Check In - 08/04/21 0813       Check-In   Supervising physician immediately available to respond to emergencies See telemetry face sheet for immediately available ER MD    Location [facility]-Cardiac & Pulmonary Rehab    Staff Present Heath Lark, RN, BSN, CCRP;Joseph [place], RCP,RRT,BSRT;Kelly [place], [place], ACSM CEP, Exercise Physiologist    Virtual Visit No    Medication changes reported     No    Fall or balance concerns reported    No    Warm-up and Cool-down Performed on first and last piece of equipment    Resistance Training Performed Yes    VAD Patient? No    PAD/SET Patient? No      Pain Assessment   Currently in Pain? No/denies                Social History   Tobacco Use  Smoking Status Never  Smokeless Tobacco Never    Goals Met:  Exercise tolerated well Personal goals reviewed No report of concerns or symptoms today  Goals Unmet:  Not Applicable  Comments: First full day of exercise!  Patient was oriented to gym and equipment including functions, settings, policies, and procedures.  Patient's individual exercise prescription and treatment plan were reviewed.  All starting workloads were established based on the results of the 6 minute walk test done at initial orientation visit.  The plan for exercise progression was also introduced and progression will be customized based on patient's performance and goals.    Dr. Emily Filbert is Medical Director for [facility].  Dr. Ottie Glazier is Medical Director for [facility] Pulmonary Rehabilitation.

## 2021-08-06 DIAGNOSIS — Z951 Presence of aortocoronary bypass graft: Secondary | ICD-10-CM | POA: Diagnosis not present

## 2021-08-06 NOTE — Progress Notes (Signed)
Daily Session Note  Patient Details  Name: Nathan Hammond MRN: [id number] Date of Birth: 1965/05/01 Referring Provider:   Flowsheet Row Cardiac Rehab from 07/29/2021 in [facility] Cardiac and Pulmonary Rehab  Referring Provider Kate Sable MD       Encounter Date: 08/06/2021  Check In:  Session Check In - 08/06/21 0721       Check-In   Supervising physician immediately available to respond to emergencies See telemetry face sheet for immediately available ER MD    Location [facility]-Cardiac & Pulmonary Rehab    Staff Present Carson Myrtle, BS, RRT, CPFT;Joseph Karie Fetch, MPA, RN    Virtual Visit No    Medication changes reported     No    Fall or balance concerns reported    No    Warm-up and Cool-down Performed on first and last piece of equipment    Resistance Training Performed Yes    VAD Patient? No    PAD/SET Patient? No      Pain Assessment   Currently in Pain? No/denies                Social History   Tobacco Use  Smoking Status Never  Smokeless Tobacco Never    Goals Met:  Independence with exercise equipment Exercise tolerated well No report of concerns or symptoms today Strength training completed today  Goals Unmet:  Not Applicable  Comments: Pt able to follow exercise prescription today without complaint.  Will continue to monitor for progression.    Dr. Emily Filbert is Medical Director for [facility].  Dr. Ottie Glazier is Medical Director for [facility] Pulmonary Rehabilitation.

## 2021-08-08 ENCOUNTER — Encounter: Payer: 59 | Admitting: *Deleted

## 2021-08-08 DIAGNOSIS — Z951 Presence of aortocoronary bypass graft: Secondary | ICD-10-CM | POA: Diagnosis not present

## 2021-08-08 NOTE — Progress Notes (Signed)
Daily Session Note  Patient Details  Name: Nathan Hammond MRN: [id number] Date of Birth: 23-Jan-1966 Referring Provider:   Flowsheet Row Cardiac Rehab from 07/29/2021 in [facility] Cardiac and Pulmonary Rehab  Referring Provider Kate Sable MD       Encounter Date: 08/08/2021  Check In:  Session Check In - 08/08/21 0914       Check-In   Supervising physician immediately available to respond to emergencies See telemetry face sheet for immediately available ER MD    Location [facility]-Cardiac & Pulmonary Rehab    Staff Present Heath Lark, RN, BSN, CCRP;Joseph [place], RCP,RRT,BSRT;Jessica [place], [place], [place], CCRP, CCET    Virtual Visit No    Medication changes reported     No    Fall or balance concerns reported    No    Warm-up and Cool-down Performed on first and last piece of equipment    Resistance Training Performed Yes    VAD Patient? No    PAD/SET Patient? No      Pain Assessment   Currently in Pain? No/denies                Social History   Tobacco Use  Smoking Status Never  Smokeless Tobacco Never    Goals Met:  Independence with exercise equipment Exercise tolerated well No report of concerns or symptoms today  Goals Unmet:  Not Applicable  Comments: Pt able to follow exercise prescription today without complaint.  Will continue to monitor for progression.    Dr. Emily Filbert is Medical Director for [facility].  Dr. Ottie Glazier is Medical Director for [facility] Pulmonary Rehabilitation.

## 2021-08-11 ENCOUNTER — Encounter: Payer: 59 | Admitting: *Deleted

## 2021-08-11 DIAGNOSIS — Z951 Presence of aortocoronary bypass graft: Secondary | ICD-10-CM

## 2021-08-11 NOTE — Progress Notes (Signed)
Daily Session Note  Patient Details  Name: Nathan Hammond MRN: [id number] Date of Birth: 1965-07-05 Referring Provider:   Flowsheet Row Cardiac Rehab from 07/29/2021 in [facility] Cardiac and Pulmonary Rehab  Referring Provider Kate Sable MD       Encounter Date: 08/11/2021 3 Check In:  Session Check In - 08/11/21 0808       Check-In   Supervising physician immediately available to respond to emergencies See telemetry face sheet for immediately available ER MD    Location [facility]-Cardiac & Pulmonary Rehab    Staff Present Heath Lark, RN, BSN, [place], BS, ACSM CEP, Exercise Physiologist;Jessica [place], [place], RCEP, CCRP, CCET;Joseph [place], [place]    Virtual Visit No    Medication changes reported     No    Fall or balance concerns reported    No    Warm-up and Cool-down Performed on first and last piece of equipment    Resistance Training Performed Yes    VAD Patient? No    PAD/SET Patient? No      Pain Assessment   Currently in Pain? No/denies               Exercise Prescription Changes - 08/11/21 0700       Home Exercise Plan   Plans to continue exercise at Home (comment)   treadmill, staff videos, walking   Frequency Add 2 additional days to program exercise sessions.    Initial Home Exercises Provided 08/11/21             Social History   Tobacco Use  Smoking Status Never  Smokeless Tobacco Never    Goals Met:  Independence with exercise equipment Exercise tolerated well No report of concerns or symptoms today  Goals Unmet:  Not Applicable  Comments: Pt able to follow exercise prescription today without complaint.  Will continue to monitor for progression.    Dr. Emily Filbert is Medical Director for [facility].  Dr. Ottie Glazier is Medical Director for [facility] Pulmonary Rehabilitation.

## 2021-08-13 ENCOUNTER — Encounter: Payer: 59 | Admitting: *Deleted

## 2021-08-13 ENCOUNTER — Encounter: Payer: Self-pay | Admitting: *Deleted

## 2021-08-13 DIAGNOSIS — Z951 Presence of aortocoronary bypass graft: Secondary | ICD-10-CM

## 2021-08-13 NOTE — Progress Notes (Signed)
Cardiac Individual Treatment Plan  Patient Details  Name: Nathan Hammond MRN: [id number] Date of Birth: 1965-07-03 Referring Provider:   Flowsheet Row Cardiac Rehab from 07/29/2021 in [facility] Cardiac and Pulmonary Rehab  Referring Provider Kate Sable MD       Initial Encounter Date:  Flowsheet Row Cardiac Rehab from 07/29/2021 in [facility] Cardiac and Pulmonary Rehab  Date 07/29/21       Visit Diagnosis: S/P CABG x 4  Patient's Home Medications on Admission:  Current Outpatient Medications:    Ascorbic Acid (VITAMIN C) 1000 MG tablet, Take 1,000 mg by mouth daily., Disp: , Rfl:    aspirin EC 325 MG EC tablet, Take 1 tablet (325 mg total) by mouth daily., Disp: 30 tablet, Rfl: 0   Coenzyme Q10 (COQ-10) 100 MG CAPS, Take 100 mg by mouth daily., Disp: , Rfl:    ezetimibe (ZETIA) 10 MG tablet, Take 1 tablet (10 mg total) by mouth daily., Disp: 90 tablet, Rfl: 3   Menaquinone-7 (K2 PO), Take 1 tablet by mouth daily., Disp: , Rfl:    metoprolol tartrate (LOPRESSOR) 25 MG tablet, Take 0.5 tablets (12.5 mg total) by mouth 2 (two) times daily., Disp: 30 tablet, Rfl: 1   Multiple Vitamin (MULTIVITAMIN WITH MINERALS) TABS tablet, Take 1 tablet by mouth daily., Disp: , Rfl:    OMEGA-3 FATTY ACIDS PO, Take 1 capsule by mouth in the morning., Disp: , Rfl:    rosuvastatin (CRESTOR) 40 MG tablet, Take 1 tablet (40 mg total) by mouth daily., Disp: 30 tablet, Rfl: 3  Past Medical History: Past Medical History:  Diagnosis Date   Coronary artery disease    a. 04/2021 Cath: LM nl, LAD 30ost/p, 90p/m, 88m LCX 839m, OM1/2/3 nl, RCA 10052mPDA fills via L->R collats from dLAD, EF 55-65%; b. 05/2021 CABG x 3: LIMA->LAD, VG->OM, VG->RCA.   Essential hypertension    History of echocardiogram    a. 04/2021 Echo: EF 60-65%, no rwma, nl RV fxn.   Hyperlipidemia LDL goal <70     Tobacco Use: Social History   Tobacco Use  Smoking Status Never  Smokeless Tobacco Never    Labs: Review Flowsheet        Latest Ref Rng & Units 05/21/2021 06/05/2021 06/09/2021 07/10/2021  Labs for ITP Cardiac and Pulmonary Rehab  Cholestrol 0 - 200 mg/dL 154  - - 108   LDL (calc) 0 - 99 mg/dL 77  - - 47   HDL-C >40 mg/dL 66  - - 50   Trlycerides <150 mg/dL 54  - - 57   Hemoglobin A1c 4.8 - 5.6 % - 5.3  - -  PH, Arterial 7.35 - 7.45 - 7.51  7.344  7.340  7.369  7.371  7.396  7.351  -  PCO2 arterial 32 - 48 mmHg - 32  39.9  40.7  45.3  41.3  41.6  46.4  -  Bicarbonate 20.0 - 28.0 mmol/L - 25.5  21.8  22.3  26.6  23.9  24.2  25.5  25.7  -  TCO2 22 - 32 mmol/L - - 23  24  28  25  25  26  26  25  27  24  25  27   -  Acid-base deficit 0.0 - 2.0 mmol/L - - 4.0  4.0  1.0  1.0  -  O2 Saturation % - 97.1  98  99  99  100  79  100  100  -     Exercise Target Goals:  Exercise Program Goal: Individual exercise prescription set using results from initial 6 min walk test and THRR while considering  patient's activity barriers and safety.   Exercise Prescription Goal: Initial exercise prescription builds to 30-45 minutes a day of aerobic activity, 2-3 days per week.  Home exercise guidelines will be given to patient during program as part of exercise prescription that the participant will acknowledge.   Education: Aerobic Exercise: - Group verbal and visual presentation on the components of exercise prescription. Introduces F.I.T.T principle from ACSM for exercise prescriptions.  Reviews F.I.T.T. principles of aerobic exercise including progression. Written material given at graduation.   Education: Resistance Exercise: - Group verbal and visual presentation on the components of exercise prescription. Introduces F.I.T.T principle from ACSM for exercise prescriptions  Reviews F.I.T.T. principles of resistance exercise including progression. Written material given at graduation.    Education: Exercise & Equipment Safety: - Individual verbal instruction and demonstration of equipment use and safety with use of the  equipment. Flowsheet Row Cardiac Rehab from 08/13/2021 in [facility] Cardiac and Pulmonary Rehab  Education need identified 07/29/21  Date 07/29/21  Educator [place]  Instruction Review Code 1- Verbalizes Understanding       Education: Exercise Physiology & General Exercise Guidelines: - Group verbal and written instruction with models to review the exercise physiology of the cardiovascular system and associated critical values. Provides general exercise guidelines with specific guidelines to those with heart or lung disease.    Education: Flexibility, Balance, Mind/Body Relaxation: - Group verbal and visual presentation with interactive activity on the components of exercise prescription. Introduces F.I.T.T principle from ACSM for exercise prescriptions. Reviews F.I.T.T. principles of flexibility and balance exercise training including progression. Also discusses the mind body connection.  Reviews various relaxation techniques to help reduce and manage stress (i.e. Deep breathing, progressive muscle relaxation, and visualization). Balance handout provided to take home. Written material given at graduation.   Activity Barriers & Risk Stratification:  Activity Barriers & Cardiac Risk Stratification - 07/29/21 1533       Activity Barriers & Cardiac Risk Stratification   Activity Barriers Incisional Pain    Cardiac Risk Stratification High             6 Minute Walk:  6 Minute Walk     Row Name 07/29/21 1533         6 Minute Walk   Phase Initial     Distance 1415 feet     Walk Time 6 minutes     # of Rest Breaks 0     MPH 2.67     METS 3.52     RPE 7     Perceived Dyspnea  0     VO2 Peak 12.32     Symptoms No     Resting HR 63 bpm     Resting BP 108/64     Resting Oxygen Saturation  97 %     Exercise Oxygen Saturation  during 6 min walk 99 %     Max Ex. HR 81 bpm     Max Ex. BP 124/64     2 Minute Post BP 112/66              Oxygen Initial Assessment:   Oxygen  Re-Evaluation:   Oxygen Discharge (Final Oxygen Re-Evaluation):   Initial Exercise Prescription:  Initial Exercise Prescription - 07/29/21 1500       Date of Initial Exercise RX and Referring Provider   Date 07/29/21    Referring  Provider Kate Sable MD      Oxygen   Maintain Oxygen Saturation 88% or higher      Treadmill   MPH 2.8    Grade 0.5    Minutes 15    METs 3.34      Recumbant Bike   Level 3    RPM 60    Watts 42    Minutes 15    METs 3.5      NuStep   Level 3    SPM 80    Minutes 15    METs 3.5      T5 Nustep   Level 2    SPM 80    Minutes 15    METs 3.5      Prescription Details   Frequency (times per week) 3    Duration Progress to 30 minutes of continuous aerobic without signs/symptoms of physical distress      Intensity   THRR 40-80% of Max Heartrate 103 - 143    Ratings of Perceived Exertion 11-13    Perceived Dyspnea 0-4      Progression   Progression Continue to progress workloads to maintain intensity without signs/symptoms of physical distress.      Resistance Training   Training Prescription Yes    Weight 3 lb    Reps 10-15             Perform Capillary Blood Glucose checks as needed.  Exercise Prescription Changes:   Exercise Prescription Changes     Row Name 07/29/21 1500 08/11/21 0700 08/12/21 1600         Response to Exercise   Blood Pressure (Admit) 108/64 -- 100/58     Blood Pressure (Exercise) 124/64 -- 142/80     Blood Pressure (Exit) 112/66 -- 122/60     Heart Rate (Admit) 63 bpm -- 73 bpm     Heart Rate (Exercise) 81 bpm -- 117 bpm     Heart Rate (Exit) 61 bpm -- 80 bpm     Oxygen Saturation (Admit) 97 % -- --     Oxygen Saturation (Exercise) 99 % -- --     Rating of Perceived Exertion (Exercise) 7 -- 11     Perceived Dyspnea (Exercise) 0 -- --     Symptoms none -- none     Comments walk test results -- 4th full day of exercise     Duration -- -- Continue with 30 min of aerobic exercise  without signs/symptoms of physical distress.     Intensity -- -- THRR unchanged       Progression   Progression -- -- Continue to progress workloads to maintain intensity without signs/symptoms of physical distress.     Average METs -- -- 4.17       Resistance Training   Training Prescription -- -- Yes     Weight -- -- 4 lb     Reps -- -- 10-15       Interval Training   Interval Training -- -- No       Treadmill   MPH -- -- 3.5     Grade -- -- 1.5     Minutes -- -- 15     METs -- -- 4.4       NuStep   Level -- -- 2     Minutes -- -- 15     METs -- -- 5.2       T5 Nustep   Level -- -- 4  Minutes -- -- 15     METs -- -- 3.5       Home Exercise Plan   Plans to continue exercise at -- Home (comment)  treadmill, staff videos, walking Home (comment)  treadmill, staff videos, walking     Frequency -- Add 2 additional days to program exercise sessions. Add 2 additional days to program exercise sessions.     Initial Home Exercises Provided -- 08/11/21 08/11/21       Oxygen   Maintain Oxygen Saturation -- -- 88% or higher              Exercise Comments:   Exercise Comments     Row Name 08/04/21 0814           Exercise Comments First full day of exercise!  Patient was oriented to gym and equipment including functions, settings, policies, and procedures.  Patient's individual exercise prescription and treatment plan were reviewed.  All starting workloads were established based on the results of the 6 minute walk test done at initial orientation visit.  The plan for exercise progression was also introduced and progression will be customized based on patient's performance and goals.                Exercise Goals and Review:   Exercise Goals     Row Name 07/29/21 1538             Exercise Goals   Increase Physical Activity Yes       Intervention Provide advice, education, support and counseling about physical activity/exercise needs.;Develop an  individualized exercise prescription for aerobic and resistive training based on initial evaluation findings, risk stratification, comorbidities and participant's personal goals.       Expected Outcomes Short Term: Attend rehab on a regular basis to increase amount of physical activity.;Long Term: Add in home exercise to make exercise part of routine and to increase amount of physical activity.;Long Term: Exercising regularly at least 3-5 days a week.       Increase Strength and Stamina Yes       Intervention Provide advice, education, support and counseling about physical activity/exercise needs.;Develop an individualized exercise prescription for aerobic and resistive training based on initial evaluation findings, risk stratification, comorbidities and participant's personal goals.       Expected Outcomes Short Term: Increase workloads from initial exercise prescription for resistance, speed, and METs.;Short Term: Perform resistance training exercises routinely during rehab and add in resistance training at home;Long Term: Improve cardiorespiratory fitness, muscular endurance and strength as measured by increased METs and functional capacity (6MWT)       Able to understand and use rate of perceived exertion (RPE) scale Yes       Intervention Provide education and explanation on how to use RPE scale       Expected Outcomes Short Term: Able to use RPE daily in rehab to express subjective intensity level;Long Term:  Able to use RPE to guide intensity level when exercising independently       Able to understand and use Dyspnea scale Yes       Intervention Provide education and explanation on how to use Dyspnea scale       Expected Outcomes Short Term: Able to use Dyspnea scale daily in rehab to express subjective sense of shortness of breath during exertion;Long Term: Able to use Dyspnea scale to guide intensity level when exercising independently       Knowledge and understanding of Target Heart Rate Range  (  THRR) Yes       Intervention Provide education and explanation of THRR including how the numbers were predicted and where they are located for reference       Expected Outcomes Short Term: Able to state/look up THRR;Long Term: Able to use THRR to govern intensity when exercising independently;Short Term: Able to use daily as guideline for intensity in rehab       Able to check pulse independently Yes       Intervention Provide education and demonstration on how to check pulse in carotid and radial arteries.;Review the importance of being able to check your own pulse for safety during independent exercise       Expected Outcomes Short Term: Able to explain why pulse checking is important during independent exercise;Long Term: Able to check pulse independently and accurately       Understanding of Exercise Prescription Yes       Intervention Provide education, explanation, and written materials on patient's individual exercise prescription       Expected Outcomes Short Term: Able to explain program exercise prescription;Long Term: Able to explain home exercise prescription to exercise independently                Exercise Goals Re-Evaluation :  Exercise Goals Re-Evaluation     Row Name 08/04/21 0814 08/11/21 0753 08/12/21 1635         Exercise Goal Re-Evaluation   Exercise Goals Review Understanding of Exercise Prescription;Knowledge and understanding of Target Heart Rate Range (THRR);Able to understand and use Dyspnea scale;Able to understand and use rate of perceived exertion (RPE) scale Understanding of Exercise Prescription;Knowledge and understanding of Target Heart Rate Range (THRR);Able to understand and use Dyspnea scale;Able to understand and use rate of perceived exertion (RPE) scale;Increase Physical Activity;Increase Strength and Stamina;Able to check pulse independently Increase Physical Activity;Increase Strength and Stamina;Understanding of Exercise Prescription     Comments  Reviewed RPE and dyspnea scales, THR and program prescription with pt today.  Pt voiced understanding and was given a copy of goals to take home.       Short: Use RPE daily to regulate intensity. Long: Follow program prescription in THR. Reviewed home exercise with pt today.  Pt plans to walk and use treadmill at home for exercise.  We also talked about using staff videos as an option.  Reviewed THR, pulse, RPE, sign and symptoms, pulse oximetery and when to call 911 or MD.  Also discussed weather considerations and indoor options.  Pt voiced understanding. [name] is doing well for the first couple of weeks he has been here. He has already increased most of his workloads, including T4 Nustep to level 5, T5 to level 4, and treadmill to 3.5 mph/ 1.5%. He continues to reach his THR. We will continue to monitor as he progresses in the program.     Expected Outcomes Short: Use RPE daily to regulate intensity. Long: Follow program prescription in THR. Short: Start to add in treadmill at home Long: Continue to exercise independently Short: Continue to increase workload on treadmill Long: Continue to increase overall MET level              Discharge Exercise Prescription (Final Exercise Prescription Changes):  Exercise Prescription Changes - 08/12/21 1600       Response to Exercise   Blood Pressure (Admit) 100/58    Blood Pressure (Exercise) 142/80    Blood Pressure (Exit) 122/60    Heart Rate (Admit) 73 bpm    Heart  Rate (Exercise) 117 bpm    Heart Rate (Exit) 80 bpm    Rating of Perceived Exertion (Exercise) 11    Symptoms none    Comments 4th full day of exercise    Duration Continue with 30 min of aerobic exercise without signs/symptoms of physical distress.    Intensity THRR unchanged      Progression   Progression Continue to progress workloads to maintain intensity without signs/symptoms of physical distress.    Average METs 4.17      Resistance Training   Training Prescription Yes     Weight 4 lb    Reps 10-15      Interval Training   Interval Training No      Treadmill   MPH 3.5    Grade 1.5    Minutes 15    METs 4.4      NuStep   Level 2    Minutes 15    METs 5.2      T5 Nustep   Level 4    Minutes 15    METs 3.5      Home Exercise Plan   Plans to continue exercise at Home (comment)   treadmill, staff videos, walking   Frequency Add 2 additional days to program exercise sessions.    Initial Home Exercises Provided 08/11/21      Oxygen   Maintain Oxygen Saturation 88% or higher             Nutrition:  Target Goals: Understanding of nutrition guidelines, daily intake of sodium <157m, cholesterol <2076m calories 30% from fat and 7% or less from saturated fats, daily to have 5 or more servings of fruits and vegetables.  Education: All About Nutrition: -Group instruction provided by verbal, written material, interactive activities, discussions, models, and posters to present general guidelines for heart healthy nutrition including fat, fiber, MyPlate, the role of sodium in heart healthy nutrition, utilization of the nutrition label, and utilization of this knowledge for meal planning. Follow up email sent as well. Written material given at graduation. [facility]  Education need identified 07/29/21       Biometrics:  Pre Biometrics - 07/29/21 1532       Pre Biometrics   Height 5' 9"  (1.753 m)    Weight 196 lb 6.4 oz (89.1 kg)    BMI (Calculated) 28.99    Single Leg Stand 30 seconds              Nutrition Therapy Plan and Nutrition Goals:  Nutrition Therapy & Goals - 08/11/21 0753       Nutrition Therapy   RD appointment deferred Yes   Pt would not like to meet with RD at this time. Will continue to follow up.     Personal Nutrition Goals   Nutrition Goal Pt would not like to meet with RD at this time. Will continue to follow up.             Nutrition  Assessments:  MEDIFICTS Score Key: ?70 Need to make dietary changes  40-70 Heart Healthy Diet ? 40 Therapeutic Level Cholesterol Diet  [facility]  Picture Your Plate Total Score on Admission 51      Picture Your Plate Scores: <4<26nhealthy dietary pattern with much room for improvement. 41-50 Dietary pattern unlikely to meet recommendations for good health and room for improvement. 51-60 More  healthful dietary pattern, with some room for improvement.  >60 Healthy dietary pattern, although there may be some specific behaviors that could be improved.    Nutrition Goals Re-Evaluation:   Nutrition Goals Discharge (Final Nutrition Goals Re-Evaluation):   Psychosocial: Target Goals: Acknowledge presence or absence of significant depression and/or stress, maximize coping skills, provide positive support system. Participant is able to verbalize types and ability to use techniques and skills needed for reducing stress and depression.   Education: Stress, Anxiety, and Depression - Group verbal and visual presentation to define topics covered.  Reviews how body is impacted by stress, anxiety, and depression.  Also discusses healthy ways to reduce stress and to treat/manage anxiety and depression.  Written material given at graduation.   Education: Sleep Hygiene -Provides group verbal and written instruction about how sleep can affect your health.  Define sleep hygiene, discuss sleep cycles and impact of sleep habits. Review good sleep hygiene tips.    Initial Review & Psychosocial Screening:  Initial Psych Review & Screening - 07/18/21 1315       Initial Review   Current issues with None Identified      Family Dynamics   Good Support System? Yes   wife     Barriers   Psychosocial barriers to participate in program There are no identifiable barriers or psychosocial needs.;The patient should benefit from training in  stress management and relaxation.      Screening Interventions   Interventions Encouraged to exercise    Expected Outcomes Short Term goal: Utilizing psychosocial counselor, staff and physician to assist with identification of specific Stressors or current issues interfering with healing process. Setting desired goal for each stressor or current issue identified.;Long Term Goal: Stressors or current issues are controlled or eliminated.;Short Term goal: Identification and review with participant of any Quality of Life or Depression concerns found by scoring the questionnaire.;Long Term goal: The participant improves quality of Life and PHQ9 Scores as seen by post scores and/or verbalization of changes             Quality of Life Scores:   Quality of Life - 07/29/21 1515       Quality of Life   Select Quality of Life      Quality of Life Scores   Health/Function Pre 27.13 %    Socioeconomic Pre 25.56 %    Psych/Spiritual Pre 26.57 %    Family Pre 25.2 %    GLOBAL Pre 26.39 %            Scores of 19 and below usually indicate a poorer quality of life in these areas.  A difference of  2-3 points is a clinically meaningful difference.  A difference of 2-3 points in the total score of the Quality of Life Index has been associated with significant improvement in overall quality of life, self-image, physical symptoms, and general health in studies assessing change in quality of life.  PHQ-9: Review Flowsheet       07/29/2021  Depression screen PHQ 2/9  Decreased Interest 1  Down, Depressed, Hopeless 1  PHQ - 2 Score 2  Altered sleeping 2  Tired, decreased energy 1  Change in appetite 0  Feeling bad or failure about yourself  1  Trouble concentrating 0  Moving slowly or fidgety/restless 0  Suicidal thoughts 1  PHQ-9 Score 7  Difficult doing work/chores Somewhat difficult   Interpretation of Total Score  Total Score Depression Severity:  1-4 = Minimal depression, 5-9 =  Mild  depression, 10-14 = Moderate depression, 15-19 = Moderately severe depression, 20-27 = Severe depression   Psychosocial Evaluation and Intervention:  Psychosocial Evaluation - 07/18/21 1335       Psychosocial Evaluation & Interventions   Comments Kevan has no barriers toattending the program. He lives wit his wife.She is is support. He has been on a weight loss journey since last May and has loss over 100 pounds to date. All by following  the [place]. His goal is to lose more weight and to get off his cholesterol medication. He is ready to start the program and continue to heal and learn more about being heart healthy..    Expected Outcomes STG Attends all the scheduled sessions, attends education sessions too. LTG Continues to progress with his exercise and weight loss goals.             Psychosocial Re-Evaluation:   Psychosocial Discharge (Final Psychosocial Re-Evaluation):   Vocational Rehabilitation: Provide vocational rehab assistance to qualifying candidates.   Vocational Rehab Evaluation & Intervention:   Education: Education Goals: Education classes will be provided on a variety of topics geared toward better understanding of heart health and risk factor modification. Participant will state understanding/return demonstration of topics presented as noted by education test scores.  Learning Barriers/Preferences:   General Cardiac Education Topics:  AED/CPR: - Group verbal and written instruction with the use of models to demonstrate the basic use of the AED with the basic ABC's of resuscitation.   Anatomy and Cardiac Procedures: - Group verbal and visual presentation and models provide information about basic cardiac anatomy and function. Reviews the testing methods done to diagnose heart disease and the outcomes of the test results. Describes the treatment choices: Medical Management, Angioplasty, or Coronary Bypass Surgery for treating various heart conditions  including Myocardial Infarction, Angina, Valve Disease, and Cardiac Arrhythmias.  Written material given at graduation. Flowsheet Row Cardiac Rehab from 08/13/2021 in [facility] Cardiac and Pulmonary Rehab  Education need identified 07/29/21       Medication Safety: - Group verbal and visual instruction to review commonly prescribed medications for heart and lung disease. Reviews the medication, class of the drug, and side effects. Includes the steps to properly store meds and maintain the prescription regimen.  Written material given at graduation.   Intimacy: - Group verbal instruction through game format to discuss how heart and lung disease can affect sexual intimacy. Written material given at graduation..   Know Your Numbers and Heart Failure: - Group verbal and visual instruction to discuss disease risk factors for cardiac and pulmonary disease and treatment options.  Reviews associated critical values for Overweight/Obesity, Hypertension, Cholesterol, and Diabetes.  Discusses basics of heart failure: signs/symptoms and treatments.  Introduces Heart Failure Zone chart for action plan for heart failure.  Written material given at graduation. Flowsheet Row Cardiac Rehab from 08/13/2021 in [facility] Cardiac and Pulmonary Rehab  Education need identified 07/29/21  Date 08/06/21  Educator SB  Instruction Review Code 1- Verbalizes Understanding       Infection Prevention: - Provides verbal and written material to individual with discussion of infection control including proper hand washing and proper equipment cleaning during exercise session. Flowsheet Row Cardiac Rehab from 08/13/2021 in [facility] Re[facility]  Education need identified 07/29/21  Date 07/29/21  Educator [place]  Instruction Review Code 1- Verbalizes Understanding       Falls Prevention: - Provides verbal and written material to individual with discussion of falls prevention and safety. Flowsheet  Row Cardiac Rehab from  08/13/2021 in [facility] Cardiac and Pulmonary Rehab  Education need identified 07/29/21  Date 07/29/21  Educator Ullin  Instruction Review Code 1- Verbalizes Understanding       Other: -Provides group and verbal instruction on various topics (see comments)   Knowledge Questionnaire Score:  Knowledge Questionnaire Score - 07/29/21 1516       Knowledge Questionnaire Score   Pre Score 21/16             Core Components/Risk Factors/Patient Goals at Admission:  Personal Goals and Risk Factors at Admission - 07/29/21 1538       Core Components/Risk Factors/Patient Goals on Admission    Weight Management Yes;Weight Loss    Intervention Weight Management: Develop a combined nutrition and exercise program designed to reach desired caloric intake, while maintaining appropriate intake of nutrient and fiber, sodium and fats, and appropriate energy expenditure required for the weight goal.;Weight Management: Provide education and appropriate resources to help participant work on and attain dietary goals.;Weight Management/Obesity: Establish reasonable short term and long term weight goals.    Admit Weight 196 lb (88.9 kg)    Goal Weight: Short Term 190 lb (86.2 kg)    Goal Weight: Long Term 175 lb (79.4 kg)   Per patient. Currently following Keto the last year   Expected Outcomes Long Term: Adherence to nutrition and physical activity/exercise program aimed toward attainment of established weight goal;Short Term: Continue to assess and modify interventions until short term weight is achieved;Weight Loss: Understanding of general recommendations for a balanced deficit meal plan, which promotes 1-2 lb weight loss per week and includes a negative energy balance of [phone] kcal/d;Understanding recommendations for meals to include 15-35% energy as protein, 25-35% energy from fat, 35-60% energy from carbohydrates, less than 244m of dietary cholesterol, 20-35 gm of total fiber daily;Understanding of  distribution of calorie intake throughout the day with the consumption of 4-5 meals/snacks    Hypertension Yes    Intervention Provide education on lifestyle modifcations including regular physical activity/exercise, weight management, moderate sodium restriction and increased consumption of fresh fruit, vegetables, and low fat dairy, alcohol moderation, and smoking cessation.;Monitor prescription use compliance.    Expected Outcomes Short Term: Continued assessment and intervention until BP is < 140/921mHG in hypertensive participants. < 130/8072mG in hypertensive participants with diabetes, heart failure or chronic kidney disease.;Long Term: Maintenance of blood pressure at goal levels.    Lipids Yes    Intervention Provide education and support for participant on nutrition & aerobic/resistive exercise along with prescribed medications to achieve LDL <68m72mDL >40mg56m Expected Outcomes Short Term: Participant states understanding of desired cholesterol values and is compliant with medications prescribed. Participant is following exercise prescription and nutrition guidelines.;Long Term: Cholesterol controlled with medications as prescribed, with individualized exercise RX and with personalized nutrition plan. Value goals: LDL < 68mg,58m > 40 mg.             Education:Diabetes - Individual verbal and written instruction to review signs/symptoms of diabetes, desired ranges of glucose level fasting, after meals and with exercise. Acknowledge that pre and post exercise glucose checks will be done for 3 sessions at entry of program.   Core Components/Risk Factors/Patient Goals Review:    Core Components/Risk Factors/Patient Goals at Discharge (Final Review):    ITP Comments:  ITP Comments     Row Name 07/18/21 1338 07/29/21 1511 08/04/21 0814 08/13/21 1320     ITP Comments Virtual orientation call  completed today. he has an appointment on Date: 07/29/2021  for EP eval and gym  Orientation.  Documentation of diagnosis can be found in [facility] Date: 06/09/2021 . Completed 6MWT and gym orientation. Initial ITP created and sent for review to Dr. Ramonita Lab, Medical Director. First full day of exercise!  Patient was oriented to gym and equipment including functions, settings, policies, and procedures.  Patient's individual exercise prescription and treatment plan were reviewed.  All starting workloads were established based on the results of the 6 minute walk test done at initial orientation visit.  The plan for exercise progression was also introduced and progression will be customized based on patient's performance and goals. 30 Day review completed. Medical Director ITP review done, changes made as directed, and signed approval by Medical Director.             Comments:

## 2021-08-13 NOTE — Progress Notes (Signed)
Daily Session Note  Patient Details  Name: Nathan Hammond MRN: [id number] Date of Birth: 04-19-1965 Referring Provider:   Flowsheet Row Cardiac Rehab from 07/29/2021 in [facility] Cardiac and Pulmonary Rehab  Referring Provider Kate Sable MD       Encounter Date: 08/13/2021  Check In:  Session Check In - 08/13/21 0806       Check-In   Supervising physician immediately available to respond to emergencies See telemetry face sheet for immediately available ER MD    Location [facility]-Cardiac & Pulmonary Rehab    Staff Present Heath Lark, RN, BSN, CCRP;Laureen Owens Shark, BS, RRT, CPFT;Jessica [place], [place], RCEP, CCRP, CCET    Virtual Visit No    Medication changes reported     No    Fall or balance concerns reported    No    Warm-up and Cool-down Performed on first and last piece of equipment    Resistance Training Performed Yes    VAD Patient? No    PAD/SET Patient? No      Pain Assessment   Currently in Pain? No/denies                Social History   Tobacco Use  Smoking Status Never  Smokeless Tobacco Never    Goals Met:  Independence with exercise equipment Exercise tolerated well No report of concerns or symptoms today  Goals Unmet:  Not Applicable  Comments: Pt able to follow exercise prescription today without complaint.  Will continue to monitor for progression.    Dr. Emily Filbert is Medical Director for [place].  Dr. Ottie Glazier is Medical Director for [facility] Pulmonary Rehabilitation.

## 2021-08-13 NOTE — Progress Notes (Deleted)
Cardiac Individual Treatment Plan  Patient Details  Name: Nathan Hammond MRN: [id number] Date of Birth: 13-Jun-1965 Referring Provider:   Flowsheet Row Cardiac Rehab from 07/29/2021 in [facility] Cardiac and Pulmonary Rehab  Referring Provider Kate Sable MD       Initial Encounter Date:  Flowsheet Row Cardiac Rehab from 07/29/2021 in [facility] Cardiac and Pulmonary Rehab  Date 07/29/21       Visit Diagnosis: S/P CABG x 4  Patient's Home Medications on Admission:  Current Outpatient Medications:    Ascorbic Acid (VITAMIN C) 1000 MG tablet, Take 1,000 mg by mouth daily., Disp: , Rfl:    aspirin EC 325 MG EC tablet, Take 1 tablet (325 mg total) by mouth daily., Disp: 30 tablet, Rfl: 0   Coenzyme Q10 (COQ-10) 100 MG CAPS, Take 100 mg by mouth daily., Disp: , Rfl:    ezetimibe (ZETIA) 10 MG tablet, Take 1 tablet (10 mg total) by mouth daily., Disp: 90 tablet, Rfl: 3   Menaquinone-7 (K2 PO), Take 1 tablet by mouth daily., Disp: , Rfl:    metoprolol tartrate (LOPRESSOR) 25 MG tablet, Take 0.5 tablets (12.5 mg total) by mouth 2 (two) times daily., Disp: 30 tablet, Rfl: 1   Multiple Vitamin (MULTIVITAMIN WITH MINERALS) TABS tablet, Take 1 tablet by mouth daily., Disp: , Rfl:    OMEGA-3 FATTY ACIDS PO, Take 1 capsule by mouth in the morning., Disp: , Rfl:    rosuvastatin (CRESTOR) 40 MG tablet, Take 1 tablet (40 mg total) by mouth daily., Disp: 30 tablet, Rfl: 3  Past Medical History: Past Medical History:  Diagnosis Date   Coronary artery disease    a. 04/2021 Cath: LM nl, LAD 30ost/p, 90p/m, 39m LCX 820m, OM1/2/3 nl, RCA 10013mPDA fills via L->R collats from dLAD, EF 55-65%; b. 05/2021 CABG x 3: LIMA->LAD, VG->OM, VG->RCA.   Essential hypertension    History of echocardiogram    a. 04/2021 Echo: EF 60-65%, no rwma, nl RV fxn.   Hyperlipidemia LDL goal <70     Tobacco Use: Social History   Tobacco Use  Smoking Status Never  Smokeless Tobacco Never    Labs: Review Flowsheet        Latest Ref Rng & Units 05/21/2021 06/05/2021 06/09/2021 07/10/2021  Labs for ITP Cardiac and Pulmonary Rehab  Cholestrol 0 - 200 mg/dL 154  - - 108   LDL (calc) 0 - 99 mg/dL 77  - - 47   HDL-C >40 mg/dL 66  - - 50   Trlycerides <150 mg/dL 54  - - 57   Hemoglobin A1c 4.8 - 5.6 % - 5.3  - -  PH, Arterial 7.35 - 7.45 - 7.51  7.344  7.340  7.369  7.371  7.396  7.351  -  PCO2 arterial 32 - 48 mmHg - 32  39.9  40.7  45.3  41.3  41.6  46.4  -  Bicarbonate 20.0 - 28.0 mmol/L - 25.5  21.8  22.3  26.6  23.9  24.2  25.5  25.7  -  TCO2 22 - 32 mmol/L - - 23  24  28  25  25  26  26  25  27  24  25  27   -  Acid-base deficit 0.0 - 2.0 mmol/L - - 4.0  4.0  1.0  1.0  -  O2 Saturation % - 97.1  98  99  99  100  79  100  100  -     Exercise Target Goals:  Exercise Program Goal: Individual exercise prescription set using results from initial 6 min walk test and THRR while considering  patient's activity barriers and safety.   Exercise Prescription Goal: Initial exercise prescription builds to 30-45 minutes a day of aerobic activity, 2-3 days per week.  Home exercise guidelines will be given to patient during program as part of exercise prescription that the participant will acknowledge.   Education: Aerobic Exercise: - Group verbal and visual presentation on the components of exercise prescription. Introduces F.I.T.T principle from ACSM for exercise prescriptions.  Reviews F.I.T.T. principles of aerobic exercise including progression. Written material given at graduation.   Education: Resistance Exercise: - Group verbal and visual presentation on the components of exercise prescription. Introduces F.I.T.T principle from ACSM for exercise prescriptions  Reviews F.I.T.T. principles of resistance exercise including progression. Written material given at graduation.    Education: Exercise & Equipment Safety: - Individual verbal instruction and demonstration of equipment use and safety with use of the  equipment. Flowsheet Row Cardiac Rehab from 08/13/2021 in [facility] Cardiac and Pulmonary Rehab  Education need identified 07/29/21  Date 07/29/21  Educator [place]  Instruction Review Code 1- Verbalizes Understanding       Education: Exercise Physiology & General Exercise Guidelines: - Group verbal and written instruction with models to review the exercise physiology of the cardiovascular system and associated critical values. Provides general exercise guidelines with specific guidelines to those with heart or lung disease.    Education: Flexibility, Balance, Mind/Body Relaxation: - Group verbal and visual presentation with interactive activity on the components of exercise prescription. Introduces F.I.T.T principle from ACSM for exercise prescriptions. Reviews F.I.T.T. principles of flexibility and balance exercise training including progression. Also discusses the mind body connection.  Reviews various relaxation techniques to help reduce and manage stress (i.e. Deep breathing, progressive muscle relaxation, and visualization). Balance handout provided to take home. Written material given at graduation.   Activity Barriers & Risk Stratification:  Activity Barriers & Cardiac Risk Stratification - 07/29/21 1533       Activity Barriers & Cardiac Risk Stratification   Activity Barriers Incisional Pain    Cardiac Risk Stratification High             6 Minute Walk:  6 Minute Walk     Row Name 07/29/21 1533         6 Minute Walk   Phase Initial     Distance 1415 feet     Walk Time 6 minutes     # of Rest Breaks 0     MPH 2.67     METS 3.52     RPE 7     Perceived Dyspnea  0     VO2 Peak 12.32     Symptoms No     Resting HR 63 bpm     Resting BP 108/64     Resting Oxygen Saturation  97 %     Exercise Oxygen Saturation  during 6 min walk 99 %     Max Ex. HR 81 bpm     Max Ex. BP 124/64     2 Minute Post BP 112/66              Oxygen Initial Assessment:   Oxygen  Re-Evaluation:   Oxygen Discharge (Final Oxygen Re-Evaluation):   Initial Exercise Prescription:  Initial Exercise Prescription - 07/29/21 1500       Date of Initial Exercise RX and Referring Provider   Date 07/29/21    Referring  Provider Kate Sable MD      Oxygen   Maintain Oxygen Saturation 88% or higher      Treadmill   MPH 2.8    Grade 0.5    Minutes 15    METs 3.34      Recumbant Bike   Level 3    RPM 60    Watts 42    Minutes 15    METs 3.5      NuStep   Level 3    SPM 80    Minutes 15    METs 3.5      T5 Nustep   Level 2    SPM 80    Minutes 15    METs 3.5      Prescription Details   Frequency (times per week) 3    Duration Progress to 30 minutes of continuous aerobic without signs/symptoms of physical distress      Intensity   THRR 40-80% of Max Heartrate 103 - 143    Ratings of Perceived Exertion 11-13    Perceived Dyspnea 0-4      Progression   Progression Continue to progress workloads to maintain intensity without signs/symptoms of physical distress.      Resistance Training   Training Prescription Yes    Weight 3 lb    Reps 10-15             Perform Capillary Blood Glucose checks as needed.  Exercise Prescription Changes:   Exercise Prescription Changes     Row Name 07/29/21 1500 08/11/21 0700 08/12/21 1600         Response to Exercise   Blood Pressure (Admit) 108/64 -- 100/58     Blood Pressure (Exercise) 124/64 -- 142/80     Blood Pressure (Exit) 112/66 -- 122/60     Heart Rate (Admit) 63 bpm -- 73 bpm     Heart Rate (Exercise) 81 bpm -- 117 bpm     Heart Rate (Exit) 61 bpm -- 80 bpm     Oxygen Saturation (Admit) 97 % -- --     Oxygen Saturation (Exercise) 99 % -- --     Rating of Perceived Exertion (Exercise) 7 -- 11     Perceived Dyspnea (Exercise) 0 -- --     Symptoms none -- none     Comments walk test results -- 4th full day of exercise     Duration -- -- Continue with 30 min of aerobic exercise  without signs/symptoms of physical distress.     Intensity -- -- THRR unchanged       Progression   Progression -- -- Continue to progress workloads to maintain intensity without signs/symptoms of physical distress.     Average METs -- -- 4.17       Resistance Training   Training Prescription -- -- Yes     Weight -- -- 4 lb     Reps -- -- 10-15       Interval Training   Interval Training -- -- No       Treadmill   MPH -- -- 3.5     Grade -- -- 1.5     Minutes -- -- 15     METs -- -- 4.4       NuStep   Level -- -- 2     Minutes -- -- 15     METs -- -- 5.2       T5 Nustep   Level -- -- 4  Minutes -- -- 15     METs -- -- 3.5       Home Exercise Plan   Plans to continue exercise at -- Home (comment)  treadmill, staff videos, walking Home (comment)  treadmill, staff videos, walking     Frequency -- Add 2 additional days to program exercise sessions. Add 2 additional days to program exercise sessions.     Initial Home Exercises Provided -- 08/11/21 08/11/21       Oxygen   Maintain Oxygen Saturation -- -- 88% or higher              Exercise Comments:   Exercise Comments     Row Name 08/04/21 0814           Exercise Comments First full day of exercise!  Patient was oriented to gym and equipment including functions, settings, policies, and procedures.  Patient's individual exercise prescription and treatment plan were reviewed.  All starting workloads were established based on the results of the 6 minute walk test done at initial orientation visit.  The plan for exercise progression was also introduced and progression will be customized based on patient's performance and goals.                Exercise Goals and Review:   Exercise Goals     Row Name 07/29/21 1538             Exercise Goals   Increase Physical Activity Yes       Intervention Provide advice, education, support and counseling about physical activity/exercise needs.;Develop an  individualized exercise prescription for aerobic and resistive training based on initial evaluation findings, risk stratification, comorbidities and participant's personal goals.       Expected Outcomes Short Term: Attend rehab on a regular basis to increase amount of physical activity.;Long Term: Add in home exercise to make exercise part of routine and to increase amount of physical activity.;Long Term: Exercising regularly at least 3-5 days a week.       Increase Strength and Stamina Yes       Intervention Provide advice, education, support and counseling about physical activity/exercise needs.;Develop an individualized exercise prescription for aerobic and resistive training based on initial evaluation findings, risk stratification, comorbidities and participant's personal goals.       Expected Outcomes Short Term: Increase workloads from initial exercise prescription for resistance, speed, and METs.;Short Term: Perform resistance training exercises routinely during rehab and add in resistance training at home;Long Term: Improve cardiorespiratory fitness, muscular endurance and strength as measured by increased METs and functional capacity (6MWT)       Able to understand and use rate of perceived exertion (RPE) scale Yes       Intervention Provide education and explanation on how to use RPE scale       Expected Outcomes Short Term: Able to use RPE daily in rehab to express subjective intensity level;Long Term:  Able to use RPE to guide intensity level when exercising independently       Able to understand and use Dyspnea scale Yes       Intervention Provide education and explanation on how to use Dyspnea scale       Expected Outcomes Short Term: Able to use Dyspnea scale daily in rehab to express subjective sense of shortness of breath during exertion;Long Term: Able to use Dyspnea scale to guide intensity level when exercising independently       Knowledge and understanding of Target Heart Rate Range  (  THRR) Yes       Intervention Provide education and explanation of THRR including how the numbers were predicted and where they are located for reference       Expected Outcomes Short Term: Able to state/look up THRR;Long Term: Able to use THRR to govern intensity when exercising independently;Short Term: Able to use daily as guideline for intensity in rehab       Able to check pulse independently Yes       Intervention Provide education and demonstration on how to check pulse in carotid and radial arteries.;Review the importance of being able to check your own pulse for safety during independent exercise       Expected Outcomes Short Term: Able to explain why pulse checking is important during independent exercise;Long Term: Able to check pulse independently and accurately       Understanding of Exercise Prescription Yes       Intervention Provide education, explanation, and written materials on patient's individual exercise prescription       Expected Outcomes Short Term: Able to explain program exercise prescription;Long Term: Able to explain home exercise prescription to exercise independently                Exercise Goals Re-Evaluation :  Exercise Goals Re-Evaluation     Row Name 08/04/21 0814 08/11/21 0753 08/12/21 1635         Exercise Goal Re-Evaluation   Exercise Goals Review Understanding of Exercise Prescription;Knowledge and understanding of Target Heart Rate Range (THRR);Able to understand and use Dyspnea scale;Able to understand and use rate of perceived exertion (RPE) scale Understanding of Exercise Prescription;Knowledge and understanding of Target Heart Rate Range (THRR);Able to understand and use Dyspnea scale;Able to understand and use rate of perceived exertion (RPE) scale;Increase Physical Activity;Increase Strength and Stamina;Able to check pulse independently Increase Physical Activity;Increase Strength and Stamina;Understanding of Exercise Prescription     Comments  Reviewed RPE and dyspnea scales, THR and program prescription with pt today.  Pt voiced understanding and was given a copy of goals to take home.       Short: Use RPE daily to regulate intensity. Long: Follow program prescription in THR. Reviewed home exercise with pt today.  Pt plans to walk and use treadmill at home for exercise.  We also talked about using staff videos as an option.  Reviewed THR, pulse, RPE, sign and symptoms, pulse oximetery and when to call 911 or MD.  Also discussed weather considerations and indoor options.  Pt voiced understanding. [name] is doing well for the first couple of weeks he has been here. He has already increased most of his workloads, including T4 Nustep to level 5, T5 to level 4, and treadmill to 3.5 mph/ 1.5%. He continues to reach his THR. We will continue to monitor as he progresses in the program.     Expected Outcomes Short: Use RPE daily to regulate intensity. Long: Follow program prescription in THR. Short: Start to add in treadmill at home Long: Continue to exercise independently Short: Continue to increase workload on treadmill Long: Continue to increase overall MET level              Discharge Exercise Prescription (Final Exercise Prescription Changes):  Exercise Prescription Changes - 08/12/21 1600       Response to Exercise   Blood Pressure (Admit) 100/58    Blood Pressure (Exercise) 142/80    Blood Pressure (Exit) 122/60    Heart Rate (Admit) 73 bpm    Heart  Rate (Exercise) 117 bpm    Heart Rate (Exit) 80 bpm    Rating of Perceived Exertion (Exercise) 11    Symptoms none    Comments 4th full day of exercise    Duration Continue with 30 min of aerobic exercise without signs/symptoms of physical distress.    Intensity THRR unchanged      Progression   Progression Continue to progress workloads to maintain intensity without signs/symptoms of physical distress.    Average METs 4.17      Resistance Training   Training Prescription Yes     Weight 4 lb    Reps 10-15      Interval Training   Interval Training No      Treadmill   MPH 3.5    Grade 1.5    Minutes 15    METs 4.4      NuStep   Level 2    Minutes 15    METs 5.2      T5 Nustep   Level 4    Minutes 15    METs 3.5      Home Exercise Plan   Plans to continue exercise at Home (comment)   treadmill, staff videos, walking   Frequency Add 2 additional days to program exercise sessions.    Initial Home Exercises Provided 08/11/21      Oxygen   Maintain Oxygen Saturation 88% or higher             Nutrition:  Target Goals: Understanding of nutrition guidelines, daily intake of sodium <157m, cholesterol <2090m calories 30% from fat and 7% or less from saturated fats, daily to have 5 or more servings of fruits and vegetables.  Education: All About Nutrition: -Group instruction provided by verbal, written material, interactive activities, discussions, models, and posters to present general guidelines for heart healthy nutrition including fat, fiber, MyPlate, the role of sodium in heart healthy nutrition, utilization of the nutrition label, and utilization of this knowledge for meal planning. Follow up email sent as well. Written material given at graduation. [facility]  Education need identified 07/29/21       Biometrics:  Pre Biometrics - 07/29/21 1532       Pre Biometrics   Height 5' 9"  (1.753 m)    Weight 196 lb 6.4 oz (89.1 kg)    BMI (Calculated) 28.99    Single Leg Stand 30 seconds              Nutrition Therapy Plan and Nutrition Goals:  Nutrition Therapy & Goals - 08/11/21 0753       Nutrition Therapy   RD appointment deferred Yes   Pt would not like to meet with RD at this time. Will continue to follow up.     Personal Nutrition Goals   Nutrition Goal Pt would not like to meet with RD at this time. Will continue to follow up.             Nutrition  Assessments:  MEDIFICTS Score Key: ?70 Need to make dietary changes  40-70 Heart Healthy Diet ? 40 Therapeutic Level Cholesterol Diet  [facility]  Picture Your Plate Total Score on Admission 51      Picture Your Plate Scores: <4<29nhealthy dietary pattern with much room for improvement. 41-50 Dietary pattern unlikely to meet recommendations for good health and room for improvement. 51-60 More  healthful dietary pattern, with some room for improvement.  >60 Healthy dietary pattern, although there may be some specific behaviors that could be improved.    Nutrition Goals Re-Evaluation:   Nutrition Goals Discharge (Final Nutrition Goals Re-Evaluation):   Psychosocial: Target Goals: Acknowledge presence or absence of significant depression and/or stress, maximize coping skills, provide positive support system. Participant is able to verbalize types and ability to use techniques and skills needed for reducing stress and depression.   Education: Stress, Anxiety, and Depression - Group verbal and visual presentation to define topics covered.  Reviews how body is impacted by stress, anxiety, and depression.  Also discusses healthy ways to reduce stress and to treat/manage anxiety and depression.  Written material given at graduation.   Education: Sleep Hygiene -Provides group verbal and written instruction about how sleep can affect your health.  Define sleep hygiene, discuss sleep cycles and impact of sleep habits. Review good sleep hygiene tips.    Initial Review & Psychosocial Screening:  Initial Psych Review & Screening - 07/18/21 1315       Initial Review   Current issues with None Identified      Family Dynamics   Good Support System? Yes   wife     Barriers   Psychosocial barriers to participate in program There are no identifiable barriers or psychosocial needs.;The patient should benefit from training in  stress management and relaxation.      Screening Interventions   Interventions Encouraged to exercise    Expected Outcomes Short Term goal: Utilizing psychosocial counselor, staff and physician to assist with identification of specific Stressors or current issues interfering with healing process. Setting desired goal for each stressor or current issue identified.;Long Term Goal: Stressors or current issues are controlled or eliminated.;Short Term goal: Identification and review with participant of any Quality of Life or Depression concerns found by scoring the questionnaire.;Long Term goal: The participant improves quality of Life and PHQ9 Scores as seen by post scores and/or verbalization of changes             Quality of Life Scores:   Quality of Life - 07/29/21 1515       Quality of Life   Select Quality of Life      Quality of Life Scores   Health/Function Pre 27.13 %    Socioeconomic Pre 25.56 %    Psych/Spiritual Pre 26.57 %    Family Pre 25.2 %    GLOBAL Pre 26.39 %            Scores of 19 and below usually indicate a poorer quality of life in these areas.  A difference of  2-3 points is a clinically meaningful difference.  A difference of 2-3 points in the total score of the Quality of Life Index has been associated with significant improvement in overall quality of life, self-image, physical symptoms, and general health in studies assessing change in quality of life.  PHQ-9: Review Flowsheet       07/29/2021  Depression screen PHQ 2/9  Decreased Interest 1  Down, Depressed, Hopeless 1  PHQ - 2 Score 2  Altered sleeping 2  Tired, decreased energy 1  Change in appetite 0  Feeling bad or failure about yourself  1  Trouble concentrating 0  Moving slowly or fidgety/restless 0  Suicidal thoughts 1  PHQ-9 Score 7  Difficult doing work/chores Somewhat difficult   Interpretation of Total Score  Total Score Depression Severity:  1-4 = Minimal depression, 5-9 =  Mild  depression, 10-14 = Moderate depression, 15-19 = Moderately severe depression, 20-27 = Severe depression   Psychosocial Evaluation and Intervention:  Psychosocial Evaluation - 07/18/21 1335       Psychosocial Evaluation & Interventions   Comments Abass has no barriers toattending the program. He lives wit his wife.She is is support. He has been on a weight loss journey since last May and has loss over 100 pounds to date. All by following  the [facility]. His goal is to lose more weight and to get off his cholesterol medication. He is ready to start the program and continue to heal and learn more about being heart healthy..    Expected Outcomes STG Attends all the scheduled sessions, attends education sessions too. LTG Continues to progress with his exercise and weight loss goals.             Psychosocial Re-Evaluation:   Psychosocial Discharge (Final Psychosocial Re-Evaluation):   Vocational Rehabilitation: Provide vocational rehab assistance to qualifying candidates.   Vocational Rehab Evaluation & Intervention:   Education: Education Goals: Education classes will be provided on a variety of topics geared toward better understanding of heart health and risk factor modification. Participant will state understanding/return demonstration of topics presented as noted by education test scores.  Learning Barriers/Preferences:   General Cardiac Education Topics:  AED/CPR: - Group verbal and written instruction with the use of models to demonstrate the basic use of the AED with the basic ABC's of resuscitation.   Anatomy and Cardiac Procedures: - Group verbal and visual presentation and models provide information about basic cardiac anatomy and function. Reviews the testing methods done to diagnose heart disease and the outcomes of the test results. Describes the treatment choices: Medical Management, Angioplasty, or Coronary Bypass Surgery for treating various heart conditions  including Myocardial Infarction, Angina, Valve Disease, and Cardiac Arrhythmias.  Written material given at graduation. Flowsheet Row Cardiac Rehab from 08/13/2021 in [facility] Cardiac and Pulmonary Rehab  Education need identified 07/29/21       Medication Safety: - Group verbal and visual instruction to review commonly prescribed medications for heart and lung disease. Reviews the medication, class of the drug, and side effects. Includes the steps to properly store meds and maintain the prescription regimen.  Written material given at graduation.   Intimacy: - Group verbal instruction through game format to discuss how heart and lung disease can affect sexual intimacy. Written material given at graduation..   Know Your Numbers and Heart Failure: - Group verbal and visual instruction to discuss disease risk factors for cardiac and pulmonary disease and treatment options.  Reviews associated critical values for Overweight/Obesity, Hypertension, Cholesterol, and Diabetes.  Discusses basics of heart failure: signs/symptoms and treatments.  Introduces Heart Failure Zone chart for action plan for heart failure.  Written material given at graduation. Flowsheet Row Cardiac Rehab from 08/13/2021 in [facility] Cardiac and Pulmonary Rehab  Education need identified 07/29/21  Date 08/06/21  Educator [place]  Instruction Review Code 1- Verbalizes Understanding       Infection Prevention: - Provides verbal and written material to individual with discussion of infection control including proper hand washing and proper equipment cleaning during exercise session. Flowsheet Row Cardiac Rehab from 08/13/2021 in [facility] Cardiac and Pulmonary Rehab  Education need identified 07/29/21  Date 07/29/21  Educator [place]  Instruction Review Code 1- Verbalizes Understanding       Falls Prevention: - Provides verbal and written material to individual with discussion of falls prevention and safety. Flowsheet  Row Cardiac Rehab from  08/13/2021 in [facility] Cardiac and Pulmonary Rehab  Education need identified 07/29/21  Date 07/29/21  Educator [place]  Instruction Review Code 1- Verbalizes Understanding       Other: -Provides group and verbal instruction on various topics (see comments)   Knowledge Questionnaire Score:  Knowledge Questionnaire Score - 07/29/21 1516       Knowledge Questionnaire Score   Pre Score 21/16             Core Components/Risk Factors/Patient Goals at Admission:  Personal Goals and Risk Factors at Admission - 07/29/21 1538       Core Components/Risk Factors/Patient Goals on Admission    Weight Management Yes;Weight Loss    Intervention Weight Management: Develop a combined nutrition and exercise program designed to reach desired caloric intake, while maintaining appropriate intake of nutrient and fiber, sodium and fats, and appropriate energy expenditure required for the weight goal.;Weight Management: Provide education and appropriate resources to help participant work on and attain dietary goals.;Weight Management/Obesity: Establish reasonable short term and long term weight goals.    Admit Weight 196 lb (88.9 kg)    Goal Weight: Short Term 190 lb (86.2 kg)    Goal Weight: Long Term 175 lb (79.4 kg)   Per patient. Currently following Keto the last year   Expected Outcomes Long Term: Adherence to nutrition and physical activity/exercise program aimed toward attainment of established weight goal;Short Term: Continue to assess and modify interventions until short term weight is achieved;Weight Loss: Understanding of general recommendations for a balanced deficit meal plan, which promotes 1-2 lb weight loss per week and includes a negative energy balance of [phone] kcal/d;Understanding recommendations for meals to include 15-35% energy as protein, 25-35% energy from fat, 35-60% energy from carbohydrates, less than 217m of dietary cholesterol, 20-35 gm of total fiber daily;Understanding of  distribution of calorie intake throughout the day with the consumption of 4-5 meals/snacks    Hypertension Yes    Intervention Provide education on lifestyle modifcations including regular physical activity/exercise, weight management, moderate sodium restriction and increased consumption of fresh fruit, vegetables, and low fat dairy, alcohol moderation, and smoking cessation.;Monitor prescription use compliance.    Expected Outcomes Short Term: Continued assessment and intervention until BP is < 140/952mHG in hypertensive participants. < 130/8049mG in hypertensive participants with diabetes, heart failure or chronic kidney disease.;Long Term: Maintenance of blood pressure at goal levels.    Lipids Yes    Intervention Provide education and support for participant on nutrition & aerobic/resistive exercise along with prescribed medications to achieve LDL <31m16mDL >40mg69m Expected Outcomes Short Term: Participant states understanding of desired cholesterol values and is compliant with medications prescribed. Participant is following exercise prescription and nutrition guidelines.;Long Term: Cholesterol controlled with medications as prescribed, with individualized exercise RX and with personalized nutrition plan. Value goals: LDL < 31mg,4m > 40 mg.             Education:Diabetes - Individual verbal and written instruction to review signs/symptoms of diabetes, desired ranges of glucose level fasting, after meals and with exercise. Acknowledge that pre and post exercise glucose checks will be done for 3 sessions at entry of program.   Core Components/Risk Factors/Patient Goals Review:    Core Components/Risk Factors/Patient Goals at Discharge (Final Review):    ITP Comments:  ITP Comments     Row Name 07/18/21 1338 07/29/21 1511 08/04/21 0814       ITP Comments Virtual orientation call  completed today. he has an appointment on Date: 07/29/2021  for EP eval and gym Orientation.   Documentation of diagnosis can be found in [facility] Date: 06/09/2021 . Completed 6MWT and gym orientation. Initial ITP created and sent for review to Dr. Ramonita Lab, Medical Director. First full day of exercise!  Patient was oriented to gym and equipment including functions, settings, policies, and procedures.  Patient's individual exercise prescription and treatment plan were reviewed.  All starting workloads were established based on the results of the 6 minute walk test done at initial orientation visit.  The plan for exercise progression was also introduced and progression will be customized based on patient's performance and goals.              Comments: ***

## 2021-08-15 ENCOUNTER — Encounter: Payer: 59 | Admitting: *Deleted

## 2021-08-15 DIAGNOSIS — Z951 Presence of aortocoronary bypass graft: Secondary | ICD-10-CM | POA: Diagnosis not present

## 2021-08-15 NOTE — Progress Notes (Signed)
Daily Session Note  Patient Details  Name: Nathan Hammond MRN: [id number] Date of Birth: 1965-07-22 Referring Provider:   Flowsheet Row Cardiac Rehab from 07/29/2021 in [facility] Cardiac and Pulmonary Rehab  Referring Provider Kate Sable MD       Encounter Date: 08/15/2021  Check In:  Session Check In - 08/15/21 0810       Check-In   Supervising physician immediately available to respond to emergencies See telemetry face sheet for immediately available ER MD    Location [facility]-Cardiac & Pulmonary Rehab    Staff Present Heath Lark, RN, BSN, CCRP;Joseph [place], RCP,RRT,BSRT;Jessica [place], [place], [place], CCRP, CCET    Virtual Visit No    Medication changes reported     No    Fall or balance concerns reported    No    Warm-up and Cool-down Performed on first and last piece of equipment    Resistance Training Performed Yes    VAD Patient? No    PAD/SET Patient? No      Pain Assessment   Currently in Pain? No/denies                Social History   Tobacco Use  Smoking Status Never  Smokeless Tobacco Never    Goals Met:  Independence with exercise equipment Exercise tolerated well No report of concerns or symptoms today  Goals Unmet:  Not Applicable  Comments: Pt able to follow exercise prescription today without complaint.  Will continue to monitor for progression.    Dr. Emily Filbert is Medical Director for [place].  Dr. Ottie Glazier is Medical Director for [facility]monary Rehabilitation.

## 2021-08-18 ENCOUNTER — Encounter: Payer: 59 | Admitting: *Deleted

## 2021-08-18 DIAGNOSIS — Z951 Presence of aortocoronary bypass graft: Secondary | ICD-10-CM

## 2021-08-18 NOTE — Progress Notes (Signed)
Daily Session Note  Patient Details  Name: Nathan Hammond MRN: [id number] Date of Birth: 03/20/1965 Referring Provider:   Flowsheet Row Cardiac Rehab from 07/29/2021 in [facility] Cardiac and Pulmonary Rehab  Referring Provider Agbor-Etang, Brian MD       Encounter Date: 08/18/2021  Check In:  Session Check In - 08/18/21 0911       Check-In   Supervising physician immediately available to respond to emergencies See telemetry face sheet for immediately available ER MD    Location [facility]-Cardiac & Pulmonary Rehab    Staff Present Joseph Hood, RCP,RRT,BSRT;Kelly Hayes, BS, ACSM CEP, Exercise Physiologist;Susanne Bice, RN, BSN, CCRP    Virtual Visit Yes    Medication changes reported     No    Fall or balance concerns reported    No    Warm-up and Cool-down Performed on first and last piece of equipment    Resistance Training Performed Yes    VAD Patient? No    PAD/SET Patient? No      Pain Assessment   Currently in Pain? No/denies                Social History   Tobacco Use  Smoking Status Never  Smokeless Tobacco Never    Goals Met:  Independence with exercise equipment Exercise tolerated well No report of concerns or symptoms today  Goals Unmet:  Not Applicable  Comments: Pt able to follow exercise prescription today without complaint.  Will continue to monitor for progression.    Dr. Mark Miller is Medical Director for [facility].  Dr. Fuad Aleskerov is Medical Director for [facility] Pulmonary Rehabilitation. 

## 2021-08-20 DIAGNOSIS — Z951 Presence of aortocoronary bypass graft: Secondary | ICD-10-CM

## 2021-08-20 NOTE — Progress Notes (Signed)
Daily Session Note  Patient Details  Name: Nathan Hammond MRN: [id number] Date of Birth: [birth year] Referring Provider:   Flowsheet Row Cardiac Rehab from 07/29/2021 in [facility] Cardiac and Pulmonary Rehab  Referring Provider Kate Sable MD       Encounter Date: 08/20/2021  Check In:  Session Check In - 08/20/21 0729       Check-In   Supervising physician immediately available to respond to emergencies See telemetry face sheet for immediately available ER MD    Location [facility]-Cardiac & Pulmonary Rehab    Staff Present Carson Myrtle, BS, RRT, CPFT;Kristen Coble, RN,BC,MSN;Tiphani Mells Rosalia Hammers, MPA, RN    Virtual Visit No    Medication changes reported     No    Fall or balance concerns reported    No    Warm-up and Cool-down Performed on first and last piece of equipment    Resistance Training Performed Yes    VAD Patient? No    PAD/SET Patient? No      Pain Assessment   Currently in Pain? No/denies                Social History   Tobacco Use  Smoking Status Never  Smokeless Tobacco Never    Goals Met:  Independence with exercise equipment Exercise tolerated well No report of concerns or symptoms today Strength training completed today  Goals Unmet:  Not Applicable  Comments: Pt able to follow exercise prescription today without complaint.  Will continue to monitor for progression.    Dr. Emily Filbert is Medical Director for [place].  Dr. Ottie Glazier is Medical Director for [facility] Pulmonary Rehabilitation.

## 2021-08-21 ENCOUNTER — Ambulatory Visit (INDEPENDENT_AMBULATORY_CARE_PROVIDER_SITE_OTHER): Payer: 59 | Admitting: Cardiology

## 2021-08-21 ENCOUNTER — Encounter: Payer: Self-pay | Admitting: Cardiology

## 2021-08-21 VITALS — BP 100/64 | HR 68 | Ht 68.0 in | Wt 194.0 lb

## 2021-08-21 DIAGNOSIS — I10 Essential (primary) hypertension: Secondary | ICD-10-CM

## 2021-08-21 DIAGNOSIS — E7800 Pure hypercholesterolemia, unspecified: Secondary | ICD-10-CM | POA: Diagnosis not present

## 2021-08-21 DIAGNOSIS — Z951 Presence of aortocoronary bypass graft: Secondary | ICD-10-CM

## 2021-08-21 MED ORDER — METOPROLOL SUCCINATE ER 25 MG PO TB24: 12.5000 mg | ORAL_TABLET | Freq: Every day | ORAL | 1 refills | Status: DC

## 2021-08-21 NOTE — Progress Notes (Signed)
Cardiology Office Note:    Date:  08/21/2021   ID:  Nathan Hammond, DOB 1965-06-19, MRN [id number]  PCP:  Cleon Dew, FNP   [facility] Providers Cardiologist:  Debbe Odea, MD     Referring MD: Cleon Dew, FNP   Chief Complaint  Patient presents with   Follow-up    History of Present Illness:    Nathan Hammond is a 56 y.o. male with a hx of CAD/CABG x3 (LIMA- LAD, SVG-OM, SVG RCA in 05/2021), hypertension, hyperlipidemia who presents for follow-up.  Previously seen in the clinic post CABG, tolerating statin and Zetia with no adverse effects.  Also takes aspirin and metoprolol.  Denies chest pain, currently in cardiac rehab.  Takes medications as prescribed.  Feels well, has no concerns at this time.   Prior notes Echo 04/2021 EF 60 to 65% Left heart cath 04/2021 90% mid LAD, 100% RCA, 80% left circumflex    Past Medical History:  Diagnosis Date   Coronary artery disease    a. 04/2021 Cath: LM nl, LAD 30ost/p, 90p/m, 74m, LCX 81m/d, OM1/2/3 nl, RCA 119m, RPDA fills via L->R collats from dLAD, EF 55-65%; b. 05/2021 CABG x 3: LIMA->LAD, VG->OM, VG->RCA.   Essential hypertension    History of echocardiogram    a. 04/2021 Echo: EF 60-65%, no rwma, nl RV fxn.   Hyperlipidemia LDL goal <70     Past Surgical History:  Procedure Laterality Date   CORONARY ARTERY BYPASS GRAFT N/A 06/09/2021   Procedure: CORONARY ARTERY BYPASS GRAFTING X3 USING LEFT INTERNAL MAMMORY ARTERY AND RIGHT GREATER SAPHENOUS VEIN HARVESTED ENDOSCOPICALLY;  Surgeon: Lovett Sox, MD;  Location: [facility] OR;  Service: Open Heart Surgery;  Laterality: N/A;   LEFT HEART CATH AND CORONARY ANGIOGRAPHY N/A 05/07/2021   Procedure: LEFT HEART CATH AND CORONARY ANGIOGRAPHY;  Surgeon: Iran Ouch, MD;  Location: [facility] INVASIVE CV LAB;  Service: Cardiovascular;  Laterality: N/A;   TEE WITHOUT CARDIOVERSION N/A 06/09/2021   Procedure: TRANSESOPHAGEAL ECHOCARDIOGRAM (TEE);  Surgeon: Lovett Sox,  MD;  Location: [facility] OR;  Service: Open Heart Surgery;  Laterality: N/A;    Current Medications: Current Meds  Medication Sig   Ascorbic Acid (VITAMIN C) 1000 MG tablet Take 1,000 mg by mouth daily.   aspirin EC 325 MG EC tablet Take 1 tablet (325 mg total) by mouth daily.   Coenzyme Q10 (COQ-10) 100 MG CAPS Take 100 mg by mouth daily.   ezetimibe (ZETIA) 10 MG tablet Take 1 tablet (10 mg total) by mouth daily.   Menaquinone-7 (K2 PO) Take 1 tablet by mouth daily.   metoprolol succinate (TOPROL XL) 25 MG 24 hr tablet Take 0.5 tablets (12.5 mg total) by mouth daily.   Multiple Vitamin (MULTIVITAMIN WITH MINERALS) TABS tablet Take 1 tablet by mouth daily.   OMEGA-3 FATTY ACIDS PO Take 1 capsule by mouth in the morning.   rosuvastatin (CRESTOR) 40 MG tablet Take 1 tablet (40 mg total) by mouth daily.   [DISCONTINUED] metoprolol tartrate (LOPRESSOR) 25 MG tablet Take 0.5 tablets (12.5 mg total) by mouth 2 (two) times daily.     Allergies:   Morphine   Social History   Socioeconomic History   Marital status: Single    Spouse name: Not on file   Number of children: Not on file   Years of education: Not on file   Highest education level: Not on file  Occupational History   Not on file  Tobacco Use   Smoking status: Never   Smokeless  tobacco: Never  Vaping Use   Vaping Use: Never used  Substance and Sexual Activity   Alcohol use: Yes    Comment: rare   Drug use: Never   Sexual activity: Not on file  Other Topics Concern   Not on file  Social History Narrative   Not on file   Social Determinants of Health   Financial Resource Strain: Not on file  Food Insecurity: Not on file  Transportation Needs: Not on file  Physical Activity: Not on file  Stress: Not on file  Social Connections: Not on file     Family History: The patient's family history includes Heart attack in his father.  ROS:   Please see the history of present illness.     All other systems reviewed and are  negative.  EKGs/Labs/Other Studies Reviewed:    The following studies were reviewed today:   EKG:  EKG not  ordered today.    Recent Labs: 06/10/2021: Magnesium 2.4 06/12/2021: BUN 15; Creatinine, Ser 0.98; Hemoglobin 10.4; Platelets 192; Potassium 4.2; Sodium 139 07/10/2021: ALT 21  Recent Lipid Panel    Component Value Date/Time   CHOL 108 07/10/2021 0812   TRIG 57 07/10/2021 0812   HDL 50 07/10/2021 0812   CHOLHDL 2.2 07/10/2021 0812   VLDL 11 07/10/2021 0812   LDLCALC 47 07/10/2021 0812     Risk Assessment/Calculations:          Physical Exam:    VS:  BP 100/64 (BP Location: Left Arm, Patient Position: Sitting, Cuff Size: Large)   Pulse 68   Ht 5\' 8"  (1.727 m)   Wt 194 lb (88 kg)   SpO2 99%   BMI 29.50 kg/m     Wt Readings from Last 3 Encounters:  08/21/21 194 lb (88 kg)  07/29/21 196 lb 6.4 oz (89.1 kg)  07/08/21 196 lb 8 oz (89.1 kg)     GEN:  Well nourished, well developed in no acute distress HEENT: Normal NECK: No JVD; No carotid bruits CARDIAC: RRR, no murmurs, rubs, gallops RESPIRATORY:  Clear to auscultation without rales, wheezing or rhonchi  ABDOMEN: Soft, non-tender, non-distended MUSCULOSKELETAL:  No edema; surgical/sternal scar appears well-healed SKIN: Warm and dry NEUROLOGIC:  Alert and oriented x 3 PSYCHIATRIC:  Normal affect   ASSESSMENT:    1. S/P CABG x 3   2. Primary hypertension   3. Pure hypercholesterolemia     PLAN:    In order of problems listed above:  S/p CABG x3, denies chest pain, surgical scar well-healed, currently in cardiac rehab.  Continue aspirin, Crestor, Zetia.  LDL at goal. Hypertension, BP low normal.  Stop Lopressor, start Toprol-XL 12.5 mg daily. Hyperlipidemia, LDL at goal, continue Crestor, Zetia.  Follow-up in 6 months.      Medication Adjustments/Labs and Tests Ordered: Current medicines are reviewed at length with the patient today.  Concerns regarding medicines are outlined above.  No orders  of the defined types were placed in this encounter.  Meds ordered this encounter  Medications   metoprolol succinate (TOPROL XL) 25 MG 24 hr tablet    Sig: Take 0.5 tablets (12.5 mg total) by mouth daily.    Dispense:  45 tablet    Refill:  1    Patient Instructions  Medication Instructions:   Your physician has recommended you make the following change in your medication:    STOP taking Metoprolol Tartrate (Lopressor)  2.    START taking Metoprolol Succinate (Toprol XL) 12.5 MG once  a day.  *If you need a refill on your cardiac medications before your next appointment, please call your pharmacy*    Follow-Up: At [facility], you and your health needs are our priority.  As part of our continuing mission to provide you with exceptional heart care, we have created designated Provider Care Teams.  These Care Teams include your primary Cardiologist (physician) and Advanced Practice Providers (APPs -  Physician Assistants and Nurse Practitioners) who all work together to provide you with the care you need, when you need it.  We recommend signing up for the patient portal called "MyChart".  Sign up information is provided on this After Visit Summary.  MyChart is used to connect with patients for Virtual Visits (Telemedicine).  Patients are able to view lab/test results, encounter notes, upcoming appointments, etc.  Non-urgent messages can be sent to your provider as well.   To learn more about what you can do with MyChart, go to [email or web address].    Your next appointment:   6 month(s)  The format for your next appointment:   In Person  Provider:   You may see Debbe Odea, MD or one of the following Advanced Practice Providers on your designated Care Team:   Nicolasa Ducking, NP Eula Listen, PA-C Cadence Fransico [place], [place]    Other Instructions   Important Information About Sugar         Signed, Debbe Odea, MD  08/21/2021 9:35 AM    [facility]

## 2021-08-21 NOTE — Patient Instructions (Signed)
Medication Instructions:   Your physician has recommended you make the following change in your medication:    STOP taking Metoprolol Tartrate (Lopressor)  2.    START taking Metoprolol Succinate (Toprol XL) 12.5 MG once a day.  *If you need a refill on your cardiac medications before your next appointment, please call your pharmacy*    Follow-Up: At [facility], you and your health needs are our priority.  As part of our continuing mission to provide you with exceptional heart care, we have created designated Provider Care Teams.  These Care Teams include your primary Cardiologist (physician) and Advanced Practice Providers (APPs -  Physician Assistants and Nurse Practitioners) who all work together to provide you with the care you need, when you need it.  We recommend signing up for the patient portal called "MyChart".  Sign up information is provided on this After Visit Summary.  MyChart is used to connect with patients for Virtual Visits (Telemedicine).  Patients are able to view lab/test results, encounter notes, upcoming appointments, etc.  Non-urgent messages can be sent to your provider as well.   To learn more about what you can do with MyChart, go to [email or web address].    Your next appointment:   6 month(s)  The format for your next appointment:   In Person  Provider:   You may see Debbe Odea, MD or one of the following Advanced Practice Providers on your designated Care Team:   Nicolasa Ducking, NP Eula Listen, PA-C Cadence Fransico [place], [place]    Other Instructions   Important Information About Sugar

## 2021-08-22 ENCOUNTER — Encounter: Payer: 59 | Admitting: *Deleted

## 2021-08-22 DIAGNOSIS — Z951 Presence of aortocoronary bypass graft: Secondary | ICD-10-CM | POA: Diagnosis not present

## 2021-08-25 ENCOUNTER — Other Ambulatory Visit: Payer: Self-pay | Admitting: *Deleted

## 2021-08-25 ENCOUNTER — Encounter: Payer: 59 | Admitting: *Deleted

## 2021-08-25 DIAGNOSIS — Z951 Presence of aortocoronary bypass graft: Secondary | ICD-10-CM | POA: Diagnosis not present

## 2021-08-25 MED ORDER — ROSUVASTATIN CALCIUM 40 MG PO TABS: 40.0000 mg | ORAL_TABLET | Freq: Every day | ORAL | 3 refills | Status: DC

## 2021-08-27 DIAGNOSIS — Z951 Presence of aortocoronary bypass graft: Secondary | ICD-10-CM | POA: Diagnosis not present

## 2021-08-27 NOTE — Progress Notes (Signed)
Daily Session Note  Patient Details  Name: Nathan Hammond MRN: [id number] Date of Birth: 01/06/1966 Referring Provider:   Flowsheet Row Cardiac Rehab from 07/29/2021 in [facility] Cardiac and Pulmonary Rehab  Referring Provider Kate Sable MD       Encounter Date: 08/27/2021  Check In:  Session Check In - 08/27/21 0743       Check-In   Supervising physician immediately available to respond to emergencies See telemetry face sheet for immediately available ER MD    Location [facility]-Cardiac & Pulmonary Rehab    Staff Present Carson Myrtle, BS, RRT, CPFT;Joseph Tessie Fass, Fabio Neighbors, MPA, [place]icia [place], BS, ACSM CEP, Exercise Physiologist    Virtual Visit No    Medication changes reported     No    Fall or balance concerns reported    No    Warm-up and Cool-down Performed on first and last piece of equipment    Resistance Training Performed Yes    VAD Patient? No    PAD/SET Patient? No      Pain Assessment   Currently in Pain? No/denies                Social History   Tobacco Use  Smoking Status Never  Smokeless Tobacco Never    Goals Met:  Independence with exercise equipment Exercise tolerated well No report of concerns or symptoms today Strength training completed today  Goals Unmet:  Not Applicable  Comments: Pt able to follow exercise prescription today without complaint.  Will continue to monitor for progression.    Dr. Emily Filbert is Medical Director for [place].  Dr. Ottie Glazier is Medical Director for [facility] Pulmonary Rehabilitation.

## 2021-08-29 ENCOUNTER — Encounter: Payer: 59 | Admitting: *Deleted

## 2021-08-29 DIAGNOSIS — Z951 Presence of aortocoronary bypass graft: Secondary | ICD-10-CM

## 2021-08-29 NOTE — Progress Notes (Signed)
Daily Session Note  Patient Details  Name: Nathan Hammond MRN: [id number] Date of Birth: [birth year] Referring Provider:   Flowsheet Row Cardiac Rehab from 07/29/2021 in [facility] Cardiac and Pulmonary Rehab  Referring Provider Kate Sable MD       Encounter Date: 08/29/2021  Check In:  Session Check In - 08/29/21 0813       Check-In   Supervising physician immediately available to respond to emergencies See telemetry face sheet for immediately available ER MD    Location [facility]-Cardiac & Pulmonary Rehab    Staff Present Heath Lark, RN, BSN, CCRP;Joseph [place], RCP,RRT,BSRT;Jessica [place], [place], [place], CCRP, CCET    Virtual Visit No    Medication changes reported     No    Fall or balance concerns reported    No    Warm-up and Cool-down Performed on first and last piece of equipment    Resistance Training Performed Yes    VAD Patient? No    PAD/SET Patient? No      Pain Assessment   Currently in Pain? No/denies                Social History   Tobacco Use  Smoking Status Never  Smokeless Tobacco Never    Goals Met:  Independence with exercise equipment Exercise tolerated well No report of concerns or symptoms today  Goals Unmet:  Not Applicable  Comments: Pt able to follow exercise prescription today without complaint.  Will continue to monitor for progression.    Dr. Emily Filbert is Medical Director for [place].  Dr. Ottie Glazier is Medical Director for [facility] Pulmonary Rehabilitation.

## 2021-09-01 ENCOUNTER — Encounter: Payer: Self-pay | Admitting: Cardiothoracic Surgery

## 2021-09-01 ENCOUNTER — Ambulatory Visit (INDEPENDENT_AMBULATORY_CARE_PROVIDER_SITE_OTHER): Payer: Self-pay | Admitting: Cardiothoracic Surgery

## 2021-09-01 ENCOUNTER — Encounter: Payer: 59 | Attending: Cardiology | Admitting: *Deleted

## 2021-09-01 DIAGNOSIS — L905 Scar conditions and fibrosis of skin: Secondary | ICD-10-CM

## 2021-09-01 DIAGNOSIS — Z951 Presence of aortocoronary bypass graft: Secondary | ICD-10-CM | POA: Insufficient documentation

## 2021-09-01 DIAGNOSIS — Z48812 Encounter for surgical aftercare following surgery on the circulatory system: Secondary | ICD-10-CM | POA: Diagnosis not present

## 2021-09-01 DIAGNOSIS — Z9889 Other specified postprocedural states: Secondary | ICD-10-CM

## 2021-09-01 NOTE — Progress Notes (Signed)
Daily Session Note  Patient Details  Name: Nathan Hammond MRN: [id number] Date of Birth: 1965/09/19 Referring Provider:   Flowsheet Row Cardiac Rehab from 07/29/2021 in [facility] Cardiac and Pulmonary Rehab  Referring Provider Kate Sable MD       Encounter Date: 09/01/2021  Check In:  Session Check In - 09/01/21 0806       Check-In   Supervising physician immediately available to respond to emergencies See telemetry face sheet for immediately available ER MD    Location [facility]-Cardiac & Pulmonary Rehab    Staff Present Heath Lark, RN, BSN, CCRP;Joseph [place], RCP,RRT,BSRT;Laureen [place], [place], RRT, CPFT    Virtual Visit No    Medication changes reported     No    Fall or balance concerns reported    No    Warm-up and Cool-down Performed on first and last piece of equipment    Resistance Training Performed Yes    VAD Patient? No    PAD/SET Patient? No      Pain Assessment   Currently in Pain? No/denies                Social History   Tobacco Use  Smoking Status Never  Smokeless Tobacco Never    Goals Met:  Independence with exercise equipment Exercise tolerated well No report of concerns or symptoms today  Goals Unmet:  Not Applicable  Comments: Pt able to follow exercise prescription today without complaint.  Will continue to monitor for progression.    Dr. Emily Filbert is Medical Director for [place].  Dr. Ottie Glazier is Medical Director for [facility] Pulmonary Rehabilitation.

## 2021-09-01 NOTE — Progress Notes (Signed)
  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 The patient's early postoperative recovery while in the hospital was notable for maintenance of sinus rhythm.. Since hospital discharge the patient reports progressive increase in strength and exercise tolerance and no recurrence of angina.  Surgical incisions continue to heal well.  The patient is anticipating returning to work soon now that he is 3 months postoperative. . Patient continues to be very committed to his recovery and is  finishing up cardiac rehab program.  He always walks 3 hours daily.  He has no recurrent angina and feels great.  Current Outpatient Medications  Medication Sig Dispense Refill   Ascorbic Acid (VITAMIN C) 1000 MG tablet Take 1,000 mg by mouth daily.     aspirin EC 325 MG EC tablet Take 1 tablet (325 mg total) by mouth daily. 30 tablet 0   Coenzyme Q10 (COQ-10) 100 MG CAPS Take 100 mg by mouth daily.     ezetimibe (ZETIA) 10 MG tablet Take 1 tablet (10 mg total) by mouth daily. 90 tablet 3   Menaquinone-7 (K2 PO) Take 1 tablet by mouth daily.     metoprolol succinate (TOPROL XL) 25 MG 24 hr tablet Take 0.5 tablets (12.5 mg total) by mouth daily. 45 tablet 1   Multiple Vitamin (MULTIVITAMIN WITH MINERALS) TABS tablet Take 1 tablet by mouth daily.     OMEGA-3 FATTY ACIDS PO Take 1 capsule by mouth in the morning.     rosuvastatin (CRESTOR) 40 MG tablet Take 1 tablet (40 mg total) by mouth daily. 30 tablet 3   No current facility-administered medications for this visit.    Physical Exam: Blood pressure 130/77, pulse (!) 110, resp. rate 20, height 5\' 8"  (1.727 m), weight 199 lb 1.6 oz (90.3 kg), SpO2 95 %.        Exam    General- alert and comfortable    Neck- no JVD, no cervical adenopathy palpable, no carotid bruit   Lungs- clear without rales, wheezes   Cor- regular rate and rhythm, no murmur , gallop   Abdomen- soft,  non-tender   Extremities - warm, non-tender, minimal edema   Neuro- oriented, appropriate, no focal weakness   Diagnostic Tests: None  Impression: Excellent recovery 3 months postop urgent CABG x3 He is now ready to return to work July 22. He has no restrictions other than he should not lift more than 50 pounds until October 2023, then no restrictions.  Plan: Continue heart healthy diet.  Continue heart healthy lifestyle.  He understands the components of both of these lifestyles.  Continue current medications.  He will be followed by his cardiology physicians return here as needed.   01-11-2002, MD [facility] [phone]

## 2021-09-03 DIAGNOSIS — Z951 Presence of aortocoronary bypass graft: Secondary | ICD-10-CM | POA: Diagnosis not present

## 2021-09-03 NOTE — Progress Notes (Signed)
Daily Session Note  Patient Details  Name: Nathan Hammond MRN: [id number] Date of Birth: August 18, 1965 Referring Provider:   Flowsheet Row Cardiac Rehab from 07/29/2021 in [facility] Cardiac and Pulmonary Rehab  Referring Provider Kate Sable MD       Encounter Date: 09/03/2021  Check In:  Session Check In - 09/03/21 3958       Check-In   Supervising physician immediately available to respond to emergencies See telemetry face sheet for immediately available ER MD    Location [facility]-Cardiac & Pulmonary Rehab    Staff Present Birdie Sons, MPA, RN;Jessica Luan Pulling, MA, RCEP, CCRP, CCET;Joseph [place], [place]    Virtual Visit No    Medication changes reported     No    Fall or balance concerns reported    No    Warm-up and Cool-down Performed on first and last piece of equipment    Resistance Training Performed Yes    VAD Patient? No    PAD/SET Patient? No      Pain Assessment   Currently in Pain? No/denies                Social History   Tobacco Use  Smoking Status Never  Smokeless Tobacco Never    Goals Met:  Independence with exercise equipment Exercise tolerated well No report of concerns or symptoms today Strength training completed today  Goals Unmet:  Not Applicable  Comments: Pt able to follow exercise prescription today without complaint.  Will continue to monitor for progression.    Dr. Emily Filbert is Medical Director for [place].  Dr. Ottie Glazier is Medical Director for [facility] Pulmonary Rehabilitation.

## 2021-09-05 ENCOUNTER — Encounter: Payer: 59 | Admitting: *Deleted

## 2021-09-05 DIAGNOSIS — Z951 Presence of aortocoronary bypass graft: Secondary | ICD-10-CM | POA: Diagnosis not present

## 2021-09-05 NOTE — Progress Notes (Signed)
Daily Session Note  Patient Details  Name: Nathan Hammond MRN: [id number] Date of Birth: 03-06-1965 Referring Provider:   Flowsheet Row Cardiac Rehab from 07/29/2021 in [facility] Cardiac and Pulmonary Rehab  Referring Provider Kate Sable MD       Encounter Date: 09/05/2021  Check In:  Session Check In - 09/05/21 0808       Check-In   Supervising physician immediately available to respond to emergencies See telemetry face sheet for immediately available ER MD    Location [facility]-Cardiac & Pulmonary Rehab    Staff Present Heath Lark, RN, BSN, CCRP;Jessica [place], MA, RCEP, CCRP, CCET;Joseph [place], [place]    Virtual Visit No    Medication changes reported     No    Fall or balance concerns reported    No    Warm-up and Cool-down Performed on first and last piece of equipment    Resistance Training Performed Yes    VAD Patient? No    PAD/SET Patient? No      Pain Assessment   Currently in Pain? No/denies                Social History   Tobacco Use  Smoking Status Never  Smokeless Tobacco Never    Goals Met:  Independence with exercise equipment Exercise tolerated well No report of concerns or symptoms today  Goals Unmet:  Not Applicable  Comments: Pt able to follow exercise prescription today without complaint.  Will continue to monitor for progression.    Dr. Emily Filbert is Medical Director for [place].  Dr. Ottie Glazier is Medical Director for [facility] Pulmonary Rehabilitation.

## 2021-09-08 ENCOUNTER — Encounter: Payer: 59 | Admitting: *Deleted

## 2021-09-08 DIAGNOSIS — Z951 Presence of aortocoronary bypass graft: Secondary | ICD-10-CM | POA: Diagnosis not present

## 2021-09-08 NOTE — Progress Notes (Signed)
Daily Session Note  Patient Details  Name: Nathan Hammond MRN: [id number] Date of Birth: 1966/01/25 Referring Provider:   Flowsheet Row Cardiac Rehab from 07/29/2021 in [facility] Cardiac and Pulmonary Rehab  Referring Provider Kate Sable MD       Encounter Date: 09/08/2021  Check In:  Session Check In - 09/08/21 0816       Check-In   Supervising physician immediately available to respond to emergencies See telemetry face sheet for immediately available ER MD    Location [facility]-Cardiac & Pulmonary Rehab    Staff Present Heath Lark, RN, BSN, Laveda Norman, BS, ACSM CEP, Exercise Physiologist;Joseph [place], [place]    Virtual Visit No    Medication changes reported     No    Fall or balance concerns reported    No    Warm-up and Cool-down Performed on first and last piece of equipment    Resistance Training Performed Yes    VAD Patient? No    PAD/SET Patient? No      Pain Assessment   Currently in Pain? No/denies                Social History   Tobacco Use  Smoking Status Never  Smokeless Tobacco Never    Goals Met:  Independence with exercise equipment Exercise tolerated well No report of concerns or symptoms today  Goals Unmet:  Not Applicable  Comments: Pt able to follow exercise prescription today without complaint.  Will continue to monitor for progression.    Dr. Emily Filbert is Medical Director for [place].  Dr. Ottie Glazier is Medical Director for [facility] Pulmonary Rehabilitation.

## 2021-09-10 ENCOUNTER — Encounter: Payer: Self-pay | Admitting: *Deleted

## 2021-09-10 DIAGNOSIS — Z951 Presence of aortocoronary bypass graft: Secondary | ICD-10-CM | POA: Diagnosis not present

## 2021-09-10 NOTE — Progress Notes (Signed)
Daily Session Note  Patient Details  Name: Nathan Hammond MRN: [id number] Date of Birth: 1965-04-21 Referring Provider:   Flowsheet Row Cardiac Rehab from 07/29/2021 in [facility] Cardiac and Pulmonary Rehab  Referring Provider Kate Sable MD       Encounter Date: 09/10/2021  Check In:  Session Check In - 09/10/21 0728       Check-In   Supervising physician immediately available to respond to emergencies See telemetry face sheet for immediately available ER MD    Location [facility]-Cardiac & Pulmonary Rehab    Staff Present Justin Mend, RCP,RRT,BSRT;Kaimana Lurz Rosalia Hammers, MPA, RN;Melissa [place], RDN, LDN;Jessica Luan Pulling, MA, RCEP, CCRP, CCET    Virtual Visit No    Medication changes reported     No    Fall or balance concerns reported    No    Warm-up and Cool-down Performed on first and last piece of equipment    Resistance Training Performed Yes    VAD Patient? No    PAD/SET Patient? No      Pain Assessment   Currently in Pain? No/denies                Social History   Tobacco Use  Smoking Status Never  Smokeless Tobacco Never    Goals Met:  Independence with exercise equipment Exercise tolerated well No report of concerns or symptoms today Strength training completed today  Goals Unmet:  Not Applicable  Comments: Pt able to follow exercise prescription today without complaint.  Will continue to monitor for progression.    Dr. Emily Filbert is Medical Director for [place].  Dr. Ottie Glazier is Medical Director for [facility] Pulmonary Rehabilitation.

## 2021-09-10 NOTE — Progress Notes (Signed)
Cardiac Individual Treatment Plan  Patient Details  Name: Nathan Hammond MRN: [id number] Date of Birth: [birth year] Referring Provider:   Flowsheet Row Cardiac Rehab from 07/29/2021 in [facility] Cardiac and Pulmonary Rehab  Referring Provider Kate Sable MD       Initial Encounter Date:  Flowsheet Row Cardiac Rehab from 07/29/2021 in [facility] Cardiac and Pulmonary Rehab  Date 07/29/21       Visit Diagnosis: S/P CABG x 4  Patient's Home Medications on Admission:  Current Outpatient Medications:    Ascorbic Acid (VITAMIN C) 1000 MG tablet, Take 1,000 mg by mouth daily., Disp: , Rfl:    aspirin EC 325 MG EC tablet, Take 1 tablet (325 mg total) by mouth daily., Disp: 30 tablet, Rfl: 0   Coenzyme Q10 (COQ-10) 100 MG CAPS, Take 100 mg by mouth daily., Disp: , Rfl:    ezetimibe (ZETIA) 10 MG tablet, Take 1 tablet (10 mg total) by mouth daily., Disp: 90 tablet, Rfl: 3   Menaquinone-7 (K2 PO), Take 1 tablet by mouth daily., Disp: , Rfl:    metoprolol succinate (TOPROL XL) 25 MG 24 hr tablet, Take 0.5 tablets (12.5 mg total) by mouth daily., Disp: 45 tablet, Rfl: 1   Multiple Vitamin (MULTIVITAMIN WITH MINERALS) TABS tablet, Take 1 tablet by mouth daily., Disp: , Rfl:    OMEGA-3 FATTY ACIDS PO, Take 1 capsule by mouth in the morning., Disp: , Rfl:    rosuvastatin (CRESTOR) 40 MG tablet, Take 1 tablet (40 mg total) by mouth daily., Disp: 30 tablet, Rfl: 3  Past Medical History: Past Medical History:  Diagnosis Date   Coronary artery disease    a. 04/2021 Cath: LM nl, LAD 30ost/p, 90p/m, 46m LCX 828m, OM1/2/3 nl, RCA 10074mPDA fills via L->R collats from dLAD, EF 55-65%; b. 05/2021 CABG x 3: LIMA->LAD, VG->OM, VG->RCA.   Essential hypertension    History of echocardiogram    a. 04/2021 Echo: EF 60-65%, no rwma, nl RV fxn.   Hyperlipidemia LDL goal <70     Tobacco Use: Social History   Tobacco Use  Smoking Status Never  Smokeless Tobacco Never    Labs: Review Flowsheet        Latest Ref Rng & Units 05/21/2021 06/05/2021 06/09/2021 07/10/2021  Labs for ITP Cardiac and Pulmonary Rehab  Cholestrol 0 - 200 mg/dL 154  - - 108   LDL (calc) 0 - 99 mg/dL 77  - - 47   HDL-C >40 mg/dL 66  - - 50   Trlycerides <150 mg/dL 54  - - 57   Hemoglobin A1c 4.8 - 5.6 % - 5.3  - -  PH, Arterial 7.35 - 7.45 - 7.51  7.344  7.340  7.369  7.371  7.396  7.351  -  PCO2 arterial 32 - 48 mmHg - 32  39.9  40.7  45.3  41.3  41.6  46.4  -  Bicarbonate 20.0 - 28.0 mmol/L - 25.5  21.8  22.3  26.6  23.9  24.2  25.5  25.7  -  TCO2 22 - 32 mmol/L - - 23  24  28  25  25  26  26  25  27  24  25  27   -  Acid-base deficit 0.0 - 2.0 mmol/L - - 4.0  4.0  1.0  1.0  -  O2 Saturation % - 97.1  98  99  99  100  79  100  100  -     Exercise Target Goals:  Exercise Program Goal: Individual exercise prescription set using results from initial 6 min walk test and THRR while considering  patient's activity barriers and safety.   Exercise Prescription Goal: Initial exercise prescription builds to 30-45 minutes a day of aerobic activity, 2-3 days per week.  Home exercise guidelines will be given to patient during program as part of exercise prescription that the participant will acknowledge.   Education: Aerobic Exercise: - Group verbal and visual presentation on the components of exercise prescription. Introduces F.I.T.T principle from ACSM for exercise prescriptions.  Reviews F.I.T.T. principles of aerobic exercise including progression. Written material given at graduation. Flowsheet Row Cardiac Rehab from 09/10/2021 in [facility] Cardiac and Pulmonary Rehab  Date 09/03/21  Educator [facility]  Instruction Review Code 1- Verbalizes Understanding       Education: Resistance Exercise: - Group verbal and visual presentation on the components of exercise prescription. Introduces F.I.T.T principle from ACSM for exercise prescriptions  Reviews F.I.T.T. principles of resistance exercise including progression. Written material  given at graduation.    Education: Exercise & Equipment Safety: - Individual verbal instruction and demonstration of equipment use and safety with use of the equipment. Flowsheet Row Cardiac Rehab from 09/10/2021 in [facility] Cardiac and Pulmonary Rehab  Education need identified 07/29/21  Date 07/29/21  Educator [place]  Instruction Review Code 1- Verbalizes Understanding       Education: Exercise Physiology & General Exercise Guidelines: - Group verbal and written instruction with models to review the exercise physiology of the cardiovascular system and associated critical values. Provides general exercise guidelines with specific guidelines to those with heart or lung disease.    Education: Flexibility, Balance, Mind/Body Relaxation: - Group verbal and visual presentation with interactive activity on the components of exercise prescription. Introduces F.I.T.T principle from ACSM for exercise prescriptions. Reviews F.I.T.T. principles of flexibility and balance exercise training including progression. Also discusses the mind body connection.  Reviews various relaxation techniques to help reduce and manage stress (i.e. Deep breathing, progressive muscle relaxation, and visualization). Balance handout provided to take home. Written material given at graduation.   Activity Barriers & Risk Stratification:  Activity Barriers & Cardiac Risk Stratification - 07/29/21 1533       Activity Barriers & Cardiac Risk Stratification   Activity Barriers Incisional Pain    Cardiac Risk Stratification High             6 Minute Walk:  6 Minute Walk     Row Name 07/29/21 1533         6 Minute Walk   Phase Initial     Distance 1415 feet     Walk Time 6 minutes     # of Rest Breaks 0     MPH 2.67     METS 3.52     RPE 7     Perceived Dyspnea  0     VO2 Peak 12.32     Symptoms No     Resting HR 63 bpm     Resting BP 108/64     Resting Oxygen Saturation  97 %     Exercise Oxygen Saturation   during 6 min walk 99 %     Max Ex. HR 81 bpm     Max Ex. BP 124/64     2 Minute Post BP 112/66              Oxygen Initial Assessment:   Oxygen Re-Evaluation:   Oxygen Discharge (Final Oxygen Re-Evaluation):   Initial Exercise Prescription:  Initial Exercise Prescription - 07/29/21 1500       Date of Initial Exercise RX and Referring Provider   Date 07/29/21    Referring Provider Kate Sable MD      Oxygen   Maintain Oxygen Saturation 88% or higher      Treadmill   MPH 2.8    Grade 0.5    Minutes 15    METs 3.34      Recumbant Bike   Level 3    RPM 60    Watts 42    Minutes 15    METs 3.5      NuStep   Level 3    SPM 80    Minutes 15    METs 3.5      T5 Nustep   Level 2    SPM 80    Minutes 15    METs 3.5      Prescription Details   Frequency (times per week) 3    Duration Progress to 30 minutes of continuous aerobic without signs/symptoms of physical distress      Intensity   THRR 40-80% of Max Heartrate 103 - 143    Ratings of Perceived Exertion 11-13    Perceived Dyspnea 0-4      Progression   Progression Continue to progress workloads to maintain intensity without signs/symptoms of physical distress.      Resistance Training   Training Prescription Yes    Weight 3 lb    Reps 10-15             Perform Capillary Blood Glucose checks as needed.  Exercise Prescription Changes:   Exercise Prescription Changes     Row Name 07/29/21 1500 08/11/21 0700 08/12/21 1600 08/26/21 0800 09/09/21 1500     Response to Exercise   Blood Pressure (Admit) 108/64 -- 100/58 102/60 104/62   Blood Pressure (Exercise) 124/64 -- 142/80 134/68 --   Blood Pressure (Exit) 112/66 -- 122/60 98/58 102/56   Heart Rate (Admit) 63 bpm -- 73 bpm 87 bpm 86 bpm   Heart Rate (Exercise) 81 bpm -- 117 bpm 141 bpm 140 bpm   Heart Rate (Exit) 61 bpm -- 80 bpm 86 bpm 95 bpm   Oxygen Saturation (Admit) 97 % -- -- -- --   Oxygen Saturation (Exercise) 99 % --  -- -- --   Rating of Perceived Exertion (Exercise) 7 -- 11 13 16    Perceived Dyspnea (Exercise) 0 -- -- -- --   Symptoms none -- none none none   Comments walk test results -- 4th full day of exercise -- --   Duration -- -- Continue with 30 min of aerobic exercise without signs/symptoms of physical distress. Continue with 30 min of aerobic exercise without signs/symptoms of physical distress. Continue with 30 min of aerobic exercise without signs/symptoms of physical distress.   Intensity -- -- THRR unchanged THRR unchanged THRR unchanged     Progression   Progression -- -- Continue to progress workloads to maintain intensity without signs/symptoms of physical distress. Continue to progress workloads to maintain intensity without signs/symptoms of physical distress. Continue to progress workloads to maintain intensity without signs/symptoms of physical distress.   Average METs -- -- 4.17 5.28 7.48     Resistance Training   Training Prescription -- -- Yes Yes Yes   Weight -- -- 4 lb 4 lb 5 lb   Reps -- -- 10-15 10-15 10-15     Interval Training   Interval Training -- --  No No Yes   Equipment -- -- -- -- Treadmill   Comments -- -- -- -- 104mn up 161m down with incline     Treadmill   MPH -- -- 3.5 4.5 3.5   Grade -- -- 1.5 4 15    Minutes -- -- 15 15 15    METs -- -- 4.4 6.93 10.9     NuStep   Level -- -- 2 6 6    Minutes -- -- 15 15 15    METs -- -- 5.2 6.1 7.2     T5 Nustep   Level -- -- 4 4 6    Minutes -- -- 15 15 15    METs -- -- 3.5 3.6 4.2     Home Exercise Plan   Plans to continue exercise at -- Home (comment)  treadmill, staff videos, walking Home (comment)  treadmill, staff videos, walking Home (comment)  treadmill, staff videos, walking Home (comment)  treadmill, staff videos, walking   Frequency -- Add 2 additional days to program exercise sessions. Add 2 additional days to program exercise sessions. Add 2 additional days to program exercise sessions. Add 2 additional days  to program exercise sessions.   Initial Home Exercises Provided -- 08/11/21 08/11/21 08/11/21 08/11/21     Oxygen   Maintain Oxygen Saturation -- -- 88% or higher 88% or higher 88% or higher            Exercise Comments:   Exercise Comments     Row Name 08/04/21 0814           Exercise Comments First full day of exercise!  Patient was oriented to gym and equipment including functions, settings, policies, and procedures.  Patient's individual exercise prescription and treatment plan were reviewed.  All starting workloads were established based on the results of the 6 minute walk test done at initial orientation visit.  The plan for exercise progression was also introduced and progression will be customized based on patient's performance and goals.                Exercise Goals and Review:   Exercise Goals     Row Name 07/29/21 1538             Exercise Goals   Increase Physical Activity Yes       Intervention Provide advice, education, support and counseling about physical activity/exercise needs.;Develop an individualized exercise prescription for aerobic and resistive training based on initial evaluation findings, risk stratification, comorbidities and participant's personal goals.       Expected Outcomes Short Term: Attend rehab on a regular basis to increase amount of physical activity.;Long Term: Add in home exercise to make exercise part of routine and to increase amount of physical activity.;Long Term: Exercising regularly at least 3-5 days a week.       Increase Strength and Stamina Yes       Intervention Provide advice, education, support and counseling about physical activity/exercise needs.;Develop an individualized exercise prescription for aerobic and resistive training based on initial evaluation findings, risk stratification, comorbidities and participant's personal goals.       Expected Outcomes Short Term: Increase workloads from initial exercise  prescription for resistance, speed, and METs.;Short Term: Perform resistance training exercises routinely during rehab and add in resistance training at home;Long Term: Improve cardiorespiratory fitness, muscular endurance and strength as measured by increased METs and functional capacity (6MWT)       Able to understand and use rate of perceived exertion (RPE) scale Yes  Intervention Provide education and explanation on how to use RPE scale       Expected Outcomes Short Term: Able to use RPE daily in rehab to express subjective intensity level;Long Term:  Able to use RPE to guide intensity level when exercising independently       Able to understand and use Dyspnea scale Yes       Intervention Provide education and explanation on how to use Dyspnea scale       Expected Outcomes Short Term: Able to use Dyspnea scale daily in rehab to express subjective sense of shortness of breath during exertion;Long Term: Able to use Dyspnea scale to guide intensity level when exercising independently       Knowledge and understanding of Target Heart Rate Range (THRR) Yes       Intervention Provide education and explanation of THRR including how the numbers were predicted and where they are located for reference       Expected Outcomes Short Term: Able to state/look up THRR;Long Term: Able to use THRR to govern intensity when exercising independently;Short Term: Able to use daily as guideline for intensity in rehab       Able to check pulse independently Yes       Intervention Provide education and demonstration on how to check pulse in carotid and radial arteries.;Review the importance of being able to check your own pulse for safety during independent exercise       Expected Outcomes Short Term: Able to explain why pulse checking is important during independent exercise;Long Term: Able to check pulse independently and accurately       Understanding of Exercise Prescription Yes       Intervention Provide  education, explanation, and written materials on patient's individual exercise prescription       Expected Outcomes Short Term: Able to explain program exercise prescription;Long Term: Able to explain home exercise prescription to exercise independently                Exercise Goals Re-Evaluation :  Exercise Goals Re-Evaluation     Row Name 08/04/21 0814 08/11/21 0753 08/12/21 1635 08/26/21 0827 09/09/21 1523     Exercise Goal Re-Evaluation   Exercise Goals Review Understanding of Exercise Prescription;Knowledge and understanding of Target Heart Rate Range (THRR);Able to understand and use Dyspnea scale;Able to understand and use rate of perceived exertion (RPE) scale Understanding of Exercise Prescription;Knowledge and understanding of Target Heart Rate Range (THRR);Able to understand and use Dyspnea scale;Able to understand and use rate of perceived exertion (RPE) scale;Increase Physical Activity;Increase Strength and Stamina;Able to check pulse independently Increase Physical Activity;Increase Strength and Stamina;Understanding of Exercise Prescription Increase Physical Activity;Increase Strength and Stamina;Understanding of Exercise Prescription Increase Physical Activity;Increase Strength and Stamina;Understanding of Exercise Prescription   Comments Reviewed RPE and dyspnea scales, THR and program prescription with pt today.  Pt voiced understanding and was given a copy of goals to take home.       Short: Use RPE daily to regulate intensity. Long: Follow program prescription in THR. Reviewed home exercise with pt today.  Pt plans to walk and use treadmill at home for exercise.  We also talked about using staff videos as an option.  Reviewed THR, pulse, RPE, sign and symptoms, pulse oximetery and when to call 911 or MD.  Also discussed weather considerations and indoor options.  Pt voiced understanding. Nathan Hammond is doing well for the first couple of weeks he has been here. He has already increased  most of  his workloads, including T4 Nustep to level 5, T5 to level 4, and treadmill to 3.5 mph/ 1.5%. He continues to reach his THR. We will continue to monitor as he progresses in the program. Nathan Hammond is doing well in rehab. He is up to 4.5 mph and 4.0% incline on the treadmill. He also improved to level 6 on the T4 machine. Nathan Hammond increased his average MET level up to 5.28 METs as well. We will continue to monitor his progress in the program. Nathan Hammond continues to do well in rehab.  He has started to add in intervals on the treadmill with incline.  We will encourage him to try intervals on the seated equipment too.  We will continue to montior his progress.   Expected Outcomes Short: Use RPE daily to regulate intensity. Long: Follow program prescription in THR. Short: Start to add in treadmill at home Long: Continue to exercise independently Short: Continue to increase workload on treadmill Long: Continue to increase overall MET level Short: try 5 lb weights for resistance training. Long: Continue to increase strength and stamina. Short: Add intervals to NuStep  Long: Continue to push with intervals            Discharge Exercise Prescription (Final Exercise Prescription Changes):  Exercise Prescription Changes - 09/09/21 1500       Response to Exercise   Blood Pressure (Admit) 104/62    Blood Pressure (Exit) 102/56    Heart Rate (Admit) 86 bpm    Heart Rate (Exercise) 140 bpm    Heart Rate (Exit) 95 bpm    Rating of Perceived Exertion (Exercise) 16    Symptoms none    Duration Continue with 30 min of aerobic exercise without signs/symptoms of physical distress.    Intensity THRR unchanged      Progression   Progression Continue to progress workloads to maintain intensity without signs/symptoms of physical distress.    Average METs 7.48      Resistance Training   Training Prescription Yes    Weight 5 lb    Reps 10-15      Interval Training   Interval Training Yes    Equipment Treadmill     Comments 43mn up 167m down with incline      Treadmill   MPH 3.5    Grade 15    Minutes 15    METs 10.9      NuStep   Level 6    Minutes 15    METs 7.2      T5 Nustep   Level 6    Minutes 15    METs 4.2      Home Exercise Plan   Plans to continue exercise at Home (comment)   treadmill, staff videos, walking   Frequency Add 2 additional days to program exercise sessions.    Initial Home Exercises Provided 08/11/21      Oxygen   Maintain Oxygen Saturation 88% or higher             Nutrition:  Target Goals: Understanding of nutrition guidelines, daily intake of sodium <150045mcholesterol <200m36malories 30% from fat and 7% or less from saturated fats, daily to have 5 or more servings of fruits and vegetables.  Education: All About Nutrition: -Group instruction provided by verbal, written material, interactive activities, discussions, models, and posters to present general guidelines for heart healthy nutrition including fat, fiber, MyPlate, the role of sodium in heart healthy nutrition, utilization of the nutrition label, and utilization of this  knowledge for meal planning. Follow up email sent as well. Written material given at graduation. Flowsheet Row Cardiac Rehab from 09/10/2021 in [facility] Cardiac and Pulmonary Rehab  Education need identified 07/29/21       Biometrics:  Pre Biometrics - 07/29/21 1532       Pre Biometrics   Height 5' 9"  (1.753 m)    Weight 196 lb 6.4 oz (89.1 kg)    BMI (Calculated) 28.99    Single Leg Stand 30 seconds              Nutrition Therapy Plan and Nutrition Goals:  Nutrition Therapy & Goals - 08/11/21 0753       Nutrition Therapy   RD appointment deferred Yes   Pt would not like to meet with RD at this time. Will continue to follow up.     Personal Nutrition Goals   Nutrition Goal Pt would not like to meet with RD at this time. Will continue to follow up.             Nutrition Assessments:  MEDIFICTS Score  Key: ?70 Need to make dietary changes  40-70 Heart Healthy Diet ? 40 Therapeutic Level Cholesterol Diet  Flowsheet Row Cardiac Rehab from 07/29/2021 in [facility] Cardiac and Pulmonary Rehab  Picture Your Plate Total Score on Admission 51      Picture Your Plate Scores: <92 Unhealthy dietary pattern with much room for improvement. 41-50 Dietary pattern unlikely to meet recommendations for good health and room for improvement. 51-60 More healthful dietary pattern, with some room for improvement.  >60 Healthy dietary pattern, although there may be some specific behaviors that could be improved.    Nutrition Goals Re-Evaluation:  Nutrition Goals Re-Evaluation     [place] Name 08/27/21 0735             Goals   Current Weight 195 lb (88.5 kg)       Nutrition Goal Pt would not like to meet with RD at this time. Will continue to follow up.                Nutrition Goals Discharge (Final Nutrition Goals Re-Evaluation):  Nutrition Goals Re-Evaluation - 08/27/21 0735       Goals   Current Weight 195 lb (88.5 kg)    Nutrition Goal Pt would not like to meet with RD at this time. Will continue to follow up.             Psychosocial: Target Goals: Acknowledge presence or absence of significant depression and/or stress, maximize coping skills, provide positive support system. Participant is able to verbalize types and ability to use techniques and skills needed for reducing stress and depression.   Education: Stress, Anxiety, and Depression - Group verbal and visual presentation to define topics covered.  Reviews how body is impacted by stress, anxiety, and depression.  Also discusses healthy ways to reduce stress and to treat/manage anxiety and depression.  Written material given at graduation. Flowsheet Row Cardiac Rehab from 09/10/2021 in [facility] Cardiac and Pulmonary Rehab  Date 08/20/21  Educator [facility]  Instruction Review Code 1- [employer] Understanding       Education: Sleep  Hygiene -Provides group verbal and written instruction about how sleep can affect your health.  Define sleep hygiene, discuss sleep cycles and impact of sleep habits. Review good sleep hygiene tips.    Initial Review & Psychosocial Screening:  Initial Psych Review & Screening - 07/18/21 1315  Initial Review   Current issues with None Identified      Family Dynamics   Good Support System? Yes   wife     Barriers   Psychosocial barriers to participate in program There are no identifiable barriers or psychosocial needs.;The patient should benefit from training in stress management and relaxation.      Screening Interventions   Interventions Encouraged to exercise    Expected Outcomes Short Term goal: Utilizing psychosocial counselor, staff and physician to assist with identification of specific Stressors or current issues interfering with healing process. Setting desired goal for each stressor or current issue identified.;Long Term Goal: Stressors or current issues are controlled or eliminated.;Short Term goal: Identification and review with participant of any Quality of Life or Depression concerns found by scoring the questionnaire.;Long Term goal: The participant improves quality of Life and PHQ9 Scores as seen by post scores and/or verbalization of changes             Quality of Life Scores:   Quality of Life - 07/29/21 1515       Quality of Life   Select Quality of Life      Quality of Life Scores   Health/Function Pre 27.13 %    Socioeconomic Pre 25.56 %    Psych/Spiritual Pre 26.57 %    Family Pre 25.2 %    GLOBAL Pre 26.39 %            Scores of 19 and below usually indicate a poorer quality of life in these areas.  A difference of  2-3 points is a clinically meaningful difference.  A difference of 2-3 points in the total score of the Quality of Life Index has been associated with significant improvement in overall quality of life, self-image, physical symptoms,  and general health in studies assessing change in quality of life.  PHQ-9: Review Flowsheet       08/27/2021 07/29/2021  Depression screen PHQ 2/9  Decreased Interest 0 1  Down, Depressed, Hopeless 1 1  PHQ - 2 Score 1 2  Altered sleeping 0 2  Tired, decreased energy 0 1  Change in appetite 0 0  Feeling bad or failure about yourself  3 1  Trouble concentrating 0 0  Moving slowly or fidgety/restless 0 0  Suicidal thoughts 0 1  PHQ-9 Score 4 7  Difficult doing work/chores Not difficult at all Somewhat difficult   Interpretation of Total Score  Total Score Depression Severity:  1-4 = Minimal depression, 5-9 = Mild depression, 10-14 = Moderate depression, 15-19 = Moderately severe depression, 20-27 = Severe depression   Psychosocial Evaluation and Intervention:  Psychosocial Evaluation - 07/18/21 1335       Psychosocial Evaluation & Interventions   Comments Barrett has no barriers toattending the program. He lives wit his wife.She is is support. He has been on a weight loss journey since last May and has loss over 100 pounds to date. All by following  the [place]. His goal is to lose more weight and to get off his cholesterol medication. He is ready to start the program and continue to heal and learn more about being heart healthy..    Expected Outcomes STG Attends all the scheduled sessions, attends education sessions too. LTG Continues to progress with his exercise and weight loss goals.             Psychosocial Re-Evaluation:  Psychosocial Re-Evaluation     [place] Name 08/27/21 [phone]  Psychosocial Re-Evaluation   Current issues with Current Stress Concerns       Comments Reviewed patient health questionnaire (PHQ-9) with patient for follow up. Previously, patients score indicated signs/symptoms of depression.  Reviewed to see if patient is improving symptom wise while in program.  Score improved and patient states that it is because he is feeling much better  with his health. He has some stress due to not being able to work and is worried about the bills he is going to get.       Expected Outcomes Short: Continue to attend [facility] regularly for regular exercise and social engagement. Long: Continue to improve symptoms and manage a positive mental state.       Interventions Encouraged to attend Cardiac Rehabilitation for the exercise       Continue Psychosocial Services  No Follow up required         Initial Review   Source of Stress Concerns [employer]                Psychosocial Discharge (Final Psychosocial Re-Evaluation):  Psychosocial Re-Evaluation - 08/27/21 0732       Psychosocial Re-Evaluation   Current issues with Current Stress Concerns    Comments Reviewed patient health questionnaire (PHQ-9) with patient for follow up. Previously, patients score indicated signs/symptoms of depression.  Reviewed to see if patient is improving symptom wise while in program.  Score improved and patient states that it is because he is feeling much better with his health. He has some stress due to not being able to work and is worried about the bills he is going to get.    Expected Outcomes Short: Continue to attend [facility] regularly for regular exercise and social engagement. Long: Continue to improve symptoms and manage a positive mental state.    Interventions Encouraged to attend Cardiac Rehabilitation for the exercise    Continue Psychosocial Services  No Follow up required      Initial Review   Source of Stress [employer]cer[employer]             Vocational Rehabilitation: Provide vocational rehab assistance to qualifying candidates.   Vocational Rehab Evaluation & Intervention:   Education: Education Goals: Education classes will be provided on a variety of topics geared toward better understanding of heart health and risk factor modification. Participant will state understanding/return demonstration of topics  presented as noted by education test scores.  Learning Barriers/Preferences:   General Cardiac Education Topics:  AED/CPR: - Group verbal and written instruction with the use of models to demonstrate the basic use of the AED with the basic ABC's of resuscitation.   Anatomy and Cardiac Procedures: - Group verbal and visual presentation and models provide information about basic cardiac anatomy and function. Reviews the testing methods done to diagnose heart disease and the outcomes of the test results. Describes the treatment choices: Medical Management, Angioplasty, or Coronary Bypass Surgery for treating various heart conditions including Myocardial Infarction, Angina, Valve Disease, and Cardiac Arrhythmias.  Written material given at graduation. Flowsheet Row Cardiac Rehab from 09/10/2021 in [facility] Cardiac and Pulmonary Rehab  Education need identified 07/29/21  Date 09/10/21  Educator SB  Instruction Review Code 1- Verbalizes Understanding       Medication Safety: - Group verbal and visual instruction to review commonly prescribed medications for heart and lung disease. Reviews the medication, class of the drug, and side effects. Includes the steps to properly store meds and maintain the prescription regimen.  Written material given  at graduation.   Intimacy: - Group verbal instruction through game format to discuss how heart and lung disease can affect sexual intimacy. Written material given at graduation.. Flowsheet Row Cardiac Rehab from 09/10/2021 in [facility] Cardiac and Pulmonary Rehab  Date 09/03/21  Educator [facility]  Instruction Review Code 1- Verbalizes Understanding       Know Your Numbers and Heart Failure: - Group verbal and visual instruction to discuss disease risk factors for cardiac and pulmonary disease and treatment options.  Reviews associated critical values for Overweight/Obesity, Hypertension, Cholesterol, and Diabetes.  Discusses basics of heart failure:  signs/symptoms and treatments.  Introduces Heart Failure Zone chart for action plan for heart failure.  Written material given at graduation. Flowsheet Row Cardiac Rehab from 09/10/2021 in [facility] Cardiac and Pulmonary Rehab  Education need identified 07/29/21  Date 08/06/21  Educator SB  Instruction Review Code 1- Verbalizes Understanding       Infection Prevention: - Provides verbal and written material to individual with discussion of infection control including proper hand washing and proper equipment cleaning during exercise session. Flowsheet Row Cardiac Rehab from 09/10/2021 in [facility] Cardiac and Pulmonary Rehab  Education need identified 07/29/21  Date 07/29/21  Educator [place]  Instruction Review Code 1- Verbalizes Understanding       Falls Prevention: - Provides verbal and written material to individual with discussion of falls prevention and safety. Flowsheet Row Cardiac Rehab from 09/10/2021 in [facility] Cardiac and Pulmonary Rehab  Education need identified 07/29/21  Date 07/29/21  Educator [place]  Instruction Review Code 1- Verbalizes Understanding       Other: -Provides group and verbal instruction on various topics (see comments)   Knowledge Questionnaire Score:  Knowledge Questionnaire Score - 07/29/21 1516       Knowledge Questionnaire Score   Pre Score 21/16             Core Components/Risk Factors/Patient Goals at Admission:  Personal Goals and Risk Factors at Admission - 07/29/21 1538       Core Components/Risk Factors/Patient Goals on Admission    Weight Management Yes;Weight Loss    Intervention Weight Management: Develop a combined nutrition and exercise program designed to reach desired caloric intake, while maintaining appropriate intake of nutrient and fiber, sodium and fats, and appropriate energy expenditure required for the weight goal.;Weight Management: Provide education and appropriate resources to help participant work on and attain dietary  goals.;Weight Management/Obesity: Establish reasonable short term and long term weight goals.    Admit Weight 196 lb (88.9 kg)    Goal Weight: Short Term 190 lb (86.2 kg)    Goal Weight: Long Term 175 lb (79.4 kg)   Per patient. Currently following Keto the last year   Expected Outcomes Long Term: Adherence to nutrition and physical activity/exercise program aimed toward attainment of established weight goal;Short Term: Continue to assess and modify interventions until short term weight is achieved;Weight Loss: Understanding of general recommendations for a balanced deficit meal plan, which promotes 1-2 lb weight loss per week and includes a negative energy balance of [phone] kcal/d;Understanding recommendations for meals to include 15-35% energy as protein, 25-35% energy from fat, 35-60% energy from carbohydrates, less than 283m of dietary cholesterol, 20-35 gm of total fiber daily;Understanding of distribution of calorie intake throughout the day with the consumption of 4-5 meals/snacks    Hypertension Yes    Intervention Provide education on lifestyle modifcations including regular physical activity/exercise, weight management, moderate sodium restriction and increased consumption of fresh fruit, vegetables, and  low fat dairy, alcohol moderation, and smoking cessation.;Monitor prescription use compliance.    Expected Outcomes Short Term: Continued assessment and intervention until BP is < 140/50m HG in hypertensive participants. < 130/854mHG in hypertensive participants with diabetes, heart failure or chronic kidney disease.;Long Term: Maintenance of blood pressure at goal levels.    Lipids Yes    Intervention Provide education and support for participant on nutrition & aerobic/resistive exercise along with prescribed medications to achieve LDL <7067mHDL >61m51m  Expected Outcomes Short Term: Participant states understanding of desired cholesterol values and is compliant with medications  prescribed. Participant is following exercise prescription and nutrition guidelines.;Long Term: Cholesterol controlled with medications as prescribed, with individualized exercise RX and with personalized nutrition plan. Value goals: LDL < 70mg12mL > 40 mg.             Education:Diabetes - Individual verbal and written instruction to review signs/symptoms of diabetes, desired ranges of glucose level fasting, after meals and with exercise. Acknowledge that pre and post exercise glucose checks will be done for 3 sessions at entry of program.   Core Components/Risk Factors/Patient Goals Review:   Goals and Risk Factor Review     Row Name 08/27/21 0736             Core Components/Risk Factors/Patient Goals Review   Personal Goals Review Weight Management/Obesity       Review Nathan Hammond still wants to get his weight down to 175lbs. He feels like he would feel his best if he was that weight. He wants to lose it in a healthy manner. He is eating well and feels like he does not need to change his diet.       Expected Outcomes Short: lose more weight. Long: reach weight goal.                Core Components/Risk Factors/Patient Goals at Discharge (Final Review):   Goals and Risk Factor Review - 08/27/21 0736       Core Components/Risk Factors/Patient Goals Review   Personal Goals Review Weight Management/Obesity    Review Nathan Hammond still wants to get his weight down to 175lbs. He feels like he would feel his best if he was that weight. He wants to lose it in a healthy manner. He is eating well and feels like he does not need to change his diet.    Expected Outcomes Short: lose more weight. Long: reach weight goal.             ITP Comments:  ITP Comments     Row Name 07/18/21 1338 07/29/21 1511 08/04/21 0814 08/13/21 1320 09/10/21 0956   ITP Comments Virtual orientation call completed today. he has an appointment on Date: 07/29/2021  for EP eval and gym Orientation.  [facility]  [facility] . Completed 6MWT and gym orientation. Initial ITP created and sent for review to Dr. Bert Ramonita Labical Director. First full day of exercise!  Patient was oriented to gym and equipment including functions, settings, policies, and procedures.  Patient's individual exercise prescription and treatment plan were reviewed.  All starting workloads were established based on the results of the 6 minute walk test done at initial orientation visit.  The plan for exercise progression was also introduced and progression will be customized based on patient's performance and goals. 30 Day review completed. Medical Director ITP review done, changes made as directed, and signed approval by Medical Director. 30 Day review  completed. Medical Director ITP review done, changes made as directed, and signed approval by Medical Director.            Comments:

## 2021-09-12 ENCOUNTER — Encounter: Payer: 59 | Admitting: *Deleted

## 2021-09-12 DIAGNOSIS — Z951 Presence of aortocoronary bypass graft: Secondary | ICD-10-CM

## 2021-09-12 NOTE — Progress Notes (Signed)
Daily Session Note  Patient Details  Name: Nathan Hammond MRN: [id number] Date of Birth: 1965-12-18 Referring Provider:   Flowsheet Row Cardiac Rehab from 07/29/2021 in [facility] Cardiac and Pulmonary Rehab  Referring Provider Kate Sable MD       Encounter Date: 09/12/2021  Check In:  Session Check In - 09/12/21 0732       Check-In   Supervising physician immediately available to respond to emergencies See telemetry face sheet for immediately available ER MD    Location [facility]-Cardiac & Pulmonary Rehab    Staff Present Heath Lark, RN, BSN, CCRP;Jessica Grano, MA, RCEP, CCRP, CCET;Other   Seward Carol MS, ACSM CEP, Exercise Physiologist   Virtual Visit No    Medication changes reported     No    Fall or balance concerns reported    No    Warm-up and Cool-down Performed on first and last piece of equipment    Resistance Training Performed Yes    VAD Patient? No    PAD/SET Patient? No      Pain Assessment   Currently in Pain? No/denies                Social History   Tobacco Use  Smoking Status Never  Smokeless Tobacco Never    Goals Met:  Independence with exercise equipment Exercise tolerated well No report of concerns or symptoms today  Goals Unmet:  Not Applicable  Comments: Pt able to follow exercise prescription today without complaint.  Will continue to monitor for progression.    Dr. Emily Filbert is Medical Director for [place].  Dr. Ottie Glazier is Medical Director for [facility] Pulmonary Rehabilitation.

## 2021-09-15 ENCOUNTER — Encounter: Payer: 59 | Admitting: *Deleted

## 2021-09-15 DIAGNOSIS — Z951 Presence of aortocoronary bypass graft: Secondary | ICD-10-CM | POA: Diagnosis not present

## 2021-09-15 NOTE — Progress Notes (Signed)
Daily Session Note  Patient Details  Name: Nathan Hammond MRN: [id number] Date of Birth: [birth year] Referring Provider:   Flowsheet Row Cardiac Rehab from 07/29/2021 in [facility] Cardiac and Pulmonary Rehab  Referring Provider Kate Sable MD       Encounter Date: 09/15/2021  Check In:  Session Check In - 09/15/21 0844       Check-In   Supervising physician immediately available to respond to emergencies See telemetry face sheet for immediately available ER MD    Location [facility]-Cardiac & Pulmonary Rehab    Staff Present Heath Lark, RN, BSN, CCRP;Kristen Coble, RN,BC,MSN;Kelly [place], BS, ACSM CEP, Exercise Physiologist    Virtual Visit No    Medication changes reported     No    Fall or balance concerns reported    No    Warm-up and Cool-down Performed on first and last piece of equipment    Resistance Training Performed Yes    VAD Patient? No    PAD/SET Patient? No      Pain Assessment   Currently in Pain? No/denies                Social History   Tobacco Use  Smoking Status Never  Smokeless Tobacco Never    Goals Met:  Independence with exercise equipment Exercise tolerated well No report of concerns or symptoms today  Goals Unmet:  Not Applicable  Comments: Pt able to follow exercise prescription today without complaint.  Will continue to monitor for progression.    Dr. Emily Filbert is Medical Director for [place].  Dr. Ottie Glazier is Medical Director for [facility] Pulmonary Rehabilitation.

## 2021-09-17 DIAGNOSIS — Z951 Presence of aortocoronary bypass graft: Secondary | ICD-10-CM | POA: Diagnosis not present

## 2021-09-17 NOTE — Progress Notes (Signed)
Daily Session Note  Patient Details  Name: Nathan Hammond MRN: [id number] Date of Birth: 03-Nov-1965 Referring Provider:   Flowsheet Row Cardiac Rehab from 07/29/2021 in [facility] Cardiac and Pulmonary Rehab  Referring Provider Kate Sable MD       Encounter Date: 09/17/2021  Check In:  Session Check In - 09/17/21 0718       Check-In   Supervising physician immediately available to respond to emergencies See telemetry face sheet for immediately available ER MD    Location [facility]-Cardiac & Pulmonary Rehab    Staff Present Will Bonnet, RN,BC,MSN;Jessica [place], [place], RCEP, CCRP, CCET;Melissa [place], RDN, LDN    Virtual Visit No    Medication changes reported     No    Fall or balance concerns reported    No    Warm-up and Cool-down Performed on first and last piece of equipment    Resistance Training Performed Yes    VAD Patient? No    PAD/SET Patient? No      Pain Assessment   Currently in Pain? No/denies    Multiple Pain Sites No                Social History   Tobacco Use  Smoking Status Never  Smokeless Tobacco Never    Goals Met:  Independence with exercise equipment Exercise tolerated well No report of concerns or symptoms today  Goals Unmet:  Not Applicable  Comments: Pt able to follow exercise prescription today without complaint.  Will continue to monitor for progression.    Dr. Emily Filbert is Medical Director for [place].  Dr. Ottie Glazier is Medical Director for [facility] Pulmonary Rehabilitation.

## 2021-09-22 ENCOUNTER — Encounter: Payer: 59 | Admitting: *Deleted

## 2021-09-22 VITALS — Ht 69.0 in | Wt 199.6 lb

## 2021-09-22 DIAGNOSIS — Z951 Presence of aortocoronary bypass graft: Secondary | ICD-10-CM

## 2021-09-22 NOTE — Progress Notes (Signed)
Daily Session Note  Patient Details  Name: Nathan Hammond MRN: [id number] Date of Birth: January 12, 1966 Referring Provider:   Flowsheet Row Cardiac Rehab from 07/29/2021 in [facility] Cardiac and Pulmonary Rehab  Referring Provider Kate Sable MD       Encounter Date: 09/22/2021  Check In:  Session Check In - 09/22/21 0810       Check-In   Supervising physician immediately available to respond to emergencies See telemetry face sheet for immediately available ER MD    Location [facility]-Cardiac & Pulmonary Rehab    Staff Present Heath Lark, RN, BSN, CCRP;Jessica [place], MA, RCEP, CCRP, [place], BS, ACSM CEP, Exercise Physiologist    Virtual Visit No    Medication changes reported     No    Fall or balance concerns reported    No    Warm-up and Cool-down Performed on first and last piece of equipment    Resistance Training Performed Yes    VAD Patient? No    PAD/SET Patient? No      Pain Assessment   Currently in Pain? No/denies                No data to display          [place] Name 07/29/21 1533 09/22/21 0731       6 Minute Walk   Phase Initial Discharge    Distance 1415 feet 2060 feet    Distance % Change -- 45.58 %    Distance Feet Change -- 645 ft    Walk Time 6 minutes 6 minutes    # of Rest Breaks 0 0    MPH 2.67 3.9    METS 3.52 5.06    RPE 7 13    Perceived Dyspnea  0 --    VO2 Peak 12.32 17.7    Symptoms No No    Resting HR 63 bpm 82 bpm    Resting BP 108/64 122/72    Resting Oxygen Saturation  97 % 98 %    Exercise Oxygen Saturation  during 6 min walk 99 % 98 %    Max Ex. HR 81 bpm 121 bpm    Max Ex. BP 124/64 132/70    2 Minute Post BP 112/66 --                 Social History   Tobacco Use  Smoking Status Never  Smokeless Tobacco Never    Goals Met:  Independence with exercise equipment Exercise tolerated well No report of concerns or symptoms today  Goals Unmet:  Not Applicable  Comments: Pt able  to follow exercise prescription today without complaint.  Will continue to monitor for progression.    Dr. Emily Filbert is Medical Director for [place].  Dr. Ottie Glazier is Medical Director for [facility] Pulmonary Rehabilitation.

## 2021-09-24 ENCOUNTER — Encounter: Payer: 59 | Admitting: *Deleted

## 2021-09-24 DIAGNOSIS — Z951 Presence of aortocoronary bypass graft: Secondary | ICD-10-CM | POA: Diagnosis not present

## 2021-09-24 NOTE — Progress Notes (Signed)
Daily Session Note  Patient Details  Name: Nathan Hammond MRN: [id number] Date of Birth: 09-20-1965 Referring Provider:   Flowsheet Row Cardiac Rehab from 07/29/2021 in [facility] Cardiac and Pulmonary Rehab  Referring Provider Kate Sable MD       Encounter Date: 09/24/2021  Check In:  Session Check In - 09/24/21 0802       Check-In   Supervising physician immediately available to respond to emergencies See telemetry face sheet for immediately available ER MD    Location [facility]-Cardiac & Pulmonary Rehab    Staff Present Heath Lark, RN, BSN, CCRP;Jessica [place], MA, RCEP, CCRP, [place], BS, ACSM CEP, Exercise Physiologist    Virtual Visit No    Medication changes reported     No    Fall or balance concerns reported    No    Warm-up and Cool-down Performed on first and last piece of equipment    Resistance Training Performed Yes    VAD Patient? No    PAD/SET Patient? No      Pain Assessment   Currently in Pain? No/denies                Social History   Tobacco Use  Smoking Status Never  Smokeless Tobacco Never    Goals Met:  Independence with exercise equipment Exercise tolerated well No report of concerns or symptoms today  Goals Unmet:  Not Applicable  Comments: Pt able to follow exercise prescription today without complaint.  Will continue to monitor for progression.    Dr. Emily Filbert is Medical Director for [place].  Dr. Ottie Glazier is Medical Director for [facility] Pulmonary Rehabilitation.

## 2021-09-25 NOTE — Patient Instructions (Signed)
Discharge Patient Instructions  Patient Details  Name: Nathan Hammond MRN: [id number] Date of Birth: [birth year] Referring Provider:  Bubba Camp, [place]   Number of Visits: 36  Reason for Discharge:  Patient reached a stable level of exercise. Patient independent in their exercise. Patient has met program and personal goals.  Smoking History:  Social History   Tobacco Use  Smoking Status Never  Smokeless Tobacco Never    Diagnosis:  S/P CABG x 4  Initial Exercise Prescription:  Initial Exercise Prescription - 07/29/21 1500       Date of Initial Exercise RX and Referring Provider   Date 07/29/21    Referring Provider Kate Sable MD      Oxygen   Maintain Oxygen Saturation 88% or higher      Treadmill   MPH 2.8    Grade 0.5    Minutes 15    METs 3.34      Recumbant Bike   Level 3    RPM 60    Watts 42    Minutes 15    METs 3.5      NuStep   Level 3    SPM 80    Minutes 15    METs 3.5      T5 Nustep   Level 2    SPM 80    Minutes 15    METs 3.5      Prescription Details   Frequency (times per week) 3    Duration Progress to 30 minutes of continuous aerobic without signs/symptoms of physical distress      Intensity   THRR 40-80% of Max Heartrate 103 - 143    Ratings of Perceived Exertion 11-13    Perceived Dyspnea 0-4      Progression   Progression Continue to progress workloads to maintain intensity without signs/symptoms of physical distress.      Resistance Training   Training Prescription Yes    Weight 3 lb    Reps 10-15             Discharge Exercise Prescription (Final Exercise Prescription Changes):  Exercise Prescription Changes - 09/22/21 1400       Response to Exercise   Blood Pressure (Admit) 122/64    Blood Pressure (Exit) 100/70    Heart Rate (Admit) 62 bpm    Heart Rate (Exercise) 117 bpm    Heart Rate (Exit) 93 bpm    Oxygen Saturation (Admit) 98 %    Oxygen Saturation (Exercise) 97 %    Oxygen  Saturation (Exit) 98 %    Rating of Perceived Exertion (Exercise) 15    Symptoms none    Duration Continue with 30 min of aerobic exercise without signs/symptoms of physical distress.    Intensity THRR unchanged      Progression   Progression Continue to progress workloads to maintain intensity without signs/symptoms of physical distress.    Average METs 7.75      Resistance Training   Training Prescription Yes    Weight 8 lb    Reps 10-15      Interval Training   Interval Training Yes    Equipment Treadmill    Comments 9mn up 123m down with incline      Treadmill   MPH 3.5    Grade 1    Minutes 515    METs 10.9      NuStep   Level 6    Minutes 15      Elliptical   Level  6    Speed 3.1    Minutes 15    METs 4.6      Home Exercise Plan   Plans to continue exercise at Home (comment)   treadmill, staff videos, walking   Frequency Add 2 additional days to program exercise sessions.    Initial Home Exercises Provided 08/11/21      Oxygen   Maintain Oxygen Saturation 88% or higher             Functional Capacity:  6 Minute Walk     Row Name 07/29/21 1533 09/22/21 0731       6 Minute Walk   Phase Initial Discharge    Distance 1415 feet 2060 feet    Distance % Change -- 45.58 %    Distance Feet Change -- 645 ft    Walk Time 6 minutes 6 minutes    # of Rest Breaks 0 0    MPH 2.67 3.9    METS 3.52 5.06    RPE 7 13    Perceived Dyspnea  0 --    VO2 Peak 12.32 17.7    Symptoms No No    Resting HR 63 bpm 82 bpm    Resting BP 108/64 122/72    Resting Oxygen Saturation  97 % 98 %    Exercise Oxygen Saturation  during 6 min walk 99 % 98 %    Max Ex. HR 81 bpm 121 bpm    Max Ex. BP 124/64 132/70    2 Minute Post BP 112/66 --              Nutrition & Weight - Outcomes:  Pre Biometrics - 07/29/21 1532       Pre Biometrics   Height 5' 9"  (1.753 m)    Weight 196 lb 6.4 oz (89.1 kg)    BMI (Calculated) 28.99    Single Leg Stand 30 seconds              Post Biometrics - 09/22/21 0732        Post  Biometrics   Height 5' 9"  (1.753 m)    Weight 199 lb 9.6 oz (90.5 kg)    BMI (Calculated) 29.46    Single Leg Stand 30 seconds             Nutrition:  Nutrition Therapy & Goals - 08/11/21 0753       Nutrition Therapy   RD appointment deferred Yes   Pt would not like to meet with RD at this time. Will continue to follow up.     Personal Nutrition Goals   Nutrition Goal Pt would not like to meet with RD at this time. Will continue to follow up.              Goals reviewed with patient; copy given to patient.

## 2021-09-26 ENCOUNTER — Encounter: Payer: 59 | Admitting: *Deleted

## 2021-09-26 DIAGNOSIS — Z951 Presence of aortocoronary bypass graft: Secondary | ICD-10-CM | POA: Diagnosis not present

## 2021-09-26 NOTE — Progress Notes (Signed)
Daily Session Note  Patient Details  Name: Nathan Hammond MRN: [id number] Date of Birth: April 08, 1965 Referring Provider:   Flowsheet Row Cardiac Rehab from 07/29/2021 in [facility] Cardiac and Pulmonary Rehab  Referring Provider Kate Sable MD       Encounter Date: 09/26/2021  Check In:  Session Check In - 09/26/21 0801       Check-In   Supervising physician immediately available to respond to emergencies See telemetry face sheet for immediately available ER MD    Location [facility]-Cardiac & Pulmonary Rehab    Staff Present Heath Lark, RN, BSN, CCRP;Jessica [place], MA, RCEP, CCRP, CCET;Laureen [place], BS, RRT, CPFT    Virtual Visit No    Medication changes reported     No    Fall or balance concerns reported    No    Warm-up and Cool-down Performed on first and last piece of equipment    Resistance Training Performed Yes    VAD Patient? No    PAD/SET Patient? No      Pain Assessment   Currently in Pain? No/denies                Social History   Tobacco Use  Smoking Status Never  Smokeless Tobacco Never    Goals Met:  Independence with exercise equipment Exercise tolerated well No report of concerns or symptoms today  Goals Unmet:  Not Applicable  Comments: Pt able to follow exercise prescription today without complaint.  Will continue to monitor for progression.    Dr. Emily Filbert is Medical Director for [place].  Dr. Ottie Glazier is Medical Director for [facility] Pulmonary Rehabilitation.

## 2021-09-29 ENCOUNTER — Encounter: Payer: 59 | Admitting: *Deleted

## 2021-09-29 DIAGNOSIS — Z951 Presence of aortocoronary bypass graft: Secondary | ICD-10-CM | POA: Diagnosis not present

## 2021-09-29 NOTE — Progress Notes (Signed)
Daily Session Note  Patient Details  Name: Nathan Hammond MRN: [id number] Date of Birth: 1965-12-21 Referring Provider:   Flowsheet Row Cardiac Rehab from 07/29/2021 in [facility] Cardiac and Pulmonary Rehab  Referring Provider Kate Sable MD       Encounter Date: 09/29/2021  Check In:  Session Check In - 09/29/21 0841       Check-In   Supervising physician immediately available to respond to emergencies See telemetry face sheet for immediately available ER MD    Location [facility]-Cardiac & Pulmonary Rehab    Staff Present Heath Lark, RN, BSN, Laveda Norman, BS, ACSM CEP, Exercise Physiologist;Noah Tickle, BS, Exercise Physiologist    Virtual Visit No    Medication changes reported     No    Fall or balance concerns reported    No    Warm-up and Cool-down Performed on first and last piece of equipment    Resistance Training Performed Yes    VAD Patient? No    PAD/SET Patient? No      Pain Assessment   Currently in Pain? No/denies                Social History   Tobacco Use  Smoking Status Never  Smokeless Tobacco Never    Goals Met:  Independence with exercise equipment Exercise tolerated well No report of concerns or symptoms today  Goals Unmet:  Not Applicable  Comments: Pt able to follow exercise prescription today without complaint.  Will continue to monitor for progression.    Dr. Emily Filbert is Medical Director for [place].  Dr. Ottie Glazier is Medical Director for [facility] Pulmonary Rehabilitation.

## 2021-10-01 ENCOUNTER — Encounter: Payer: 59 | Attending: Cardiology | Admitting: *Deleted

## 2021-10-01 DIAGNOSIS — Z951 Presence of aortocoronary bypass graft: Secondary | ICD-10-CM | POA: Insufficient documentation

## 2021-10-01 NOTE — Progress Notes (Signed)
Daily Session Note  Patient Details  Name: Nathan Hammond MRN: [id number] Date of Birth: [birth year] Referring Provider:   Flowsheet Row Cardiac Rehab from 07/29/2021 in [facility] Cardiac and Pulmonary Rehab  Referring Provider Kate Sable MD       Encounter Date: 10/01/2021  Check In:  Session Check In - 10/01/21 6016       Check-In   Supervising physician immediately available to respond to emergencies See telemetry face sheet for immediately available ER MD    Location [facility]-Cardiac & Pulmonary Rehab    Staff Present Heath Lark, RN, BSN, Jacklynn Bue, MS, ASCM CEP, Exercise Physiologist;Laureen Owens Shark, BS, RRT, CPFT    Virtual Visit No    Medication changes reported     No    Fall or balance concerns reported    No    Warm-up and Cool-down Performed on first and last piece of equipment    Resistance Training Performed Yes    VAD Patient? No    PAD/SET Patient? No      Pain Assessment   Currently in Pain? No/denies                Social History   Tobacco Use  Smoking Status Never  Smokeless Tobacco Never    Goals Met:  Independence with exercise equipment Exercise tolerated well No report of concerns or symptoms today  Goals Unmet:  Not Applicable  Comments: Pt able to follow exercise prescription today without complaint.  Will continue to monitor for progression.    Dr. Emily Filbert is Medical Director for [place].  Dr. Ottie Glazier is Medical Director for [facility] Pulmonary Rehabilitation.

## 2021-10-03 ENCOUNTER — Encounter: Payer: 59 | Admitting: *Deleted

## 2021-10-03 DIAGNOSIS — Z951 Presence of aortocoronary bypass graft: Secondary | ICD-10-CM | POA: Diagnosis not present

## 2021-10-03 NOTE — Progress Notes (Signed)
Daily Session Note  Patient Details  Name: Nathan Hammond MRN: [id number] Date of Birth: 1966-01-09 Referring Provider:   Flowsheet Row Cardiac Rehab from 07/29/2021 in [facility] Cardiac and Pulmonary Rehab  Referring Provider Kate Sable MD       Encounter Date: 10/03/2021  Check In:  Session Check In - 10/03/21 0807       Check-In   Supervising physician immediately available to respond to emergencies See telemetry face sheet for immediately available ER MD    Location [facility]-Cardiac & Pulmonary Rehab    Staff Present Heath Lark, RN, BSN, CCRP;Kristen Coble, RN,BC,MSN    Virtual Visit No    Medication changes reported     No    Fall or balance concerns reported    No    Warm-up and Cool-down Performed on first and last piece of equipment    Resistance Training Performed Yes    VAD Patient? No    PAD/SET Patient? No      Pain Assessment   Currently in Pain? No/denies                Social History   Tobacco Use  Smoking Status Never  Smokeless Tobacco Never    Goals Met:  Independence with exercise equipment Exercise tolerated well No report of concerns or symptoms today  Goals Unmet:  Not Applicable  Comments: Pt able to follow exercise prescription today without complaint.  Will continue to monitor for progression.    Dr. Emily Filbert is Medical Director for [place].  Dr. Ottie Glazier is Medical Director for [facility] Pulmonary Rehabilitation.

## 2021-10-06 ENCOUNTER — Encounter: Payer: 59 | Admitting: *Deleted

## 2021-10-06 DIAGNOSIS — Z951 Presence of aortocoronary bypass graft: Secondary | ICD-10-CM | POA: Diagnosis not present

## 2021-10-06 NOTE — Progress Notes (Signed)
Cardiac Individual Treatment Plan  Patient Details  Name: Nathan Hammond MRN: [id number] Date of Birth: [birth year] Referring Provider:   Flowsheet Row Cardiac Rehab from 07/29/2021 in [facility] Cardiac and Pulmonary Rehab  Referring Provider Kate Sable MD       Initial Encounter Date:  Flowsheet Row Cardiac Rehab from 07/29/2021 in [facility] Cardiac and Pulmonary Rehab  Date 07/29/21       Visit Diagnosis: S/P CABG x 4  Patient's Home Medications on Admission:  Current Outpatient Medications:    Ascorbic Acid (VITAMIN C) 1000 MG tablet, Take 1,000 mg by mouth daily., Disp: , Rfl:    aspirin EC 325 MG EC tablet, Take 1 tablet (325 mg total) by mouth daily., Disp: 30 tablet, Rfl: 0   Coenzyme Q10 (COQ-10) 100 MG CAPS, Take 100 mg by mouth daily., Disp: , Rfl:    ezetimibe (ZETIA) 10 MG tablet, Take 1 tablet (10 mg total) by mouth daily., Disp: 90 tablet, Rfl: 3   Menaquinone-7 (K2 PO), Take 1 tablet by mouth daily., Disp: , Rfl:    metoprolol succinate (TOPROL XL) 25 MG 24 hr tablet, Take 0.5 tablets (12.5 mg total) by mouth daily., Disp: 45 tablet, Rfl: 1   Multiple Vitamin (MULTIVITAMIN WITH MINERALS) TABS tablet, Take 1 tablet by mouth daily., Disp: , Rfl:    OMEGA-3 FATTY ACIDS PO, Take 1 capsule by mouth in the morning., Disp: , Rfl:    rosuvastatin (CRESTOR) 40 MG tablet, Take 1 tablet (40 mg total) by mouth daily., Disp: 30 tablet, Rfl: 3  Past Medical History: Past Medical History:  Diagnosis Date   Coronary artery disease    a. 04/2021 Cath: LM nl, LAD 30ost/p, 90p/m, 46m LCX 828m, OM1/2/3 nl, RCA 10074mPDA fills via L->R collats from dLAD, EF 55-65%; b. 05/2021 CABG x 3: LIMA->LAD, VG->OM, VG->RCA.   Essential hypertension    History of echocardiogram    a. 04/2021 Echo: EF 60-65%, no rwma, nl RV fxn.   Hyperlipidemia LDL goal <70     Tobacco Use: Social History   Tobacco Use  Smoking Status Never  Smokeless Tobacco Never    Labs: Review Flowsheet        Latest Ref Rng & Units 05/21/2021 06/05/2021 06/09/2021 07/10/2021  Labs for ITP Cardiac and Pulmonary Rehab  Cholestrol 0 - 200 mg/dL 154  - - 108   LDL (calc) 0 - 99 mg/dL 77  - - 47   HDL-C >40 mg/dL 66  - - 50   Trlycerides <150 mg/dL 54  - - 57   Hemoglobin A1c 4.8 - 5.6 % - 5.3  - -  PH, Arterial 7.35 - 7.45 - 7.51  7.344  7.340  7.369  7.371  7.396  7.351  -  PCO2 arterial 32 - 48 mmHg - 32  39.9  40.7  45.3  41.3  41.6  46.4  -  Bicarbonate 20.0 - 28.0 mmol/L - 25.5  21.8  22.3  26.6  23.9  24.2  25.5  25.7  -  TCO2 22 - 32 mmol/L - - 23  24  28  25  25  26  26  25  27  24  25  27   -  Acid-base deficit 0.0 - 2.0 mmol/L - - 4.0  4.0  1.0  1.0  -  O2 Saturation % - 97.1  98  99  99  100  79  100  100  -     Exercise Target Goals:  Exercise Program Goal: Individual exercise prescription set using results from initial 6 min walk test and THRR while considering  patient's activity barriers and safety.   Exercise Prescription Goal: Initial exercise prescription builds to 30-45 minutes a day of aerobic activity, 2-3 days per week.  Home exercise guidelines will be given to patient during program as part of exercise prescription that the participant will acknowledge.   Education: Aerobic Exercise: - Group verbal and visual presentation on the components of exercise prescription. Introduces F.I.T.T principle from ACSM for exercise prescriptions.  Reviews F.I.T.T. principles of aerobic exercise including progression. Written material given at graduation. Flowsheet Row Cardiac Rehab from 10/01/2021 in [facility] Cardiac and Pulmonary Rehab  Date 09/03/21  Educator [facility]  Instruction Review Code 1- Verbalizes Understanding       Education: Resistance Exercise: - Group verbal and visual presentation on the components of exercise prescription. Introduces F.I.T.T principle from ACSM for exercise prescriptions  Reviews F.I.T.T. principles of resistance exercise including progression. Written material given  at graduation.    Education: Exercise & Equipment Safety: - Individual verbal instruction and demonstration of equipment use and safety with use of the equipment. Flowsheet Row Cardiac Rehab from 10/01/2021 in [facility] Cardiac and Pulmonary Rehab  Education need identified 07/29/21  Date 07/29/21  Educator [place]  Instruction Review Code 1- Verbalizes Understanding       Education: Exercise Physiology & General Exercise Guidelines: - Group verbal and written instruction with models to review the exercise physiology of the cardiovascular system and associated critical values. Provides general exercise guidelines with specific guidelines to those with heart or lung disease.    Education: Flexibility, Balance, Mind/Body Relaxation: - Group verbal and visual presentation with interactive activity on the components of exercise prescription. Introduces F.I.T.T principle from ACSM for exercise prescriptions. Reviews F.I.T.T. principles of flexibility and balance exercise training including progression. Also discusses the mind body connection.  Reviews various relaxation techniques to help reduce and manage stress (i.e. Deep breathing, progressive muscle relaxation, and visualization). Balance handout provided to take home. Written material given at graduation. Flowsheet Row Cardiac Rehab from 10/01/2021 in [facility] Cardiac and Pulmonary Rehab  Date 09/17/21  Educator [facility]  Instruction Review Code 1- Verbalizes Understanding       Activity Barriers & Risk Stratification:  Activity Barriers & Cardiac Risk Stratification - 07/29/21 1533       Activity Barriers & Cardiac Risk Stratification   Activity Barriers Incisional Pain    Cardiac Risk Stratification High             6 Minute Walk:  6 Minute Walk     Row Name 07/29/21 1533 09/22/21 0731       6 Minute Walk   Phase Initial Discharge    Distance 1415 feet 2060 feet    Distance % Change -- 45.58 %    Distance Feet Change -- 645 ft    Walk  Time 6 minutes 6 minutes    # of Rest Breaks 0 0    MPH 2.67 3.9    METS 3.52 5.06    RPE 7 13    Perceived Dyspnea  0 --    VO2 Peak 12.32 17.7    Symptoms No No    Resting HR 63 bpm 82 bpm    Resting BP 108/64 122/72    Resting Oxygen Saturation  97 % 98 %    Exercise Oxygen Saturation  during 6 min walk 99 % 98 %    Max Ex.  HR 81 bpm 121 bpm    Max Ex. BP 124/64 132/70    2 Minute Post BP 112/66 --             Oxygen Initial Assessment:   Oxygen Re-Evaluation:   Oxygen Discharge (Final Oxygen Re-Evaluation):   Initial Exercise Prescription:  Initial Exercise Prescription - 07/29/21 1500       Date of Initial Exercise RX and Referring Provider   Date 07/29/21    Referring Provider Kate Sable MD      Oxygen   Maintain Oxygen Saturation 88% or higher      Treadmill   MPH 2.8    Grade 0.5    Minutes 15    METs 3.34      Recumbant Bike   Level 3    RPM 60    Watts 42    Minutes 15    METs 3.5      NuStep   Level 3    SPM 80    Minutes 15    METs 3.5      T5 Nustep   Level 2    SPM 80    Minutes 15    METs 3.5      Prescription Details   Frequency (times per week) 3    Duration Progress to 30 minutes of continuous aerobic without signs/symptoms of physical distress      Intensity   THRR 40-80% of Max Heartrate 103 - 143    Ratings of Perceived Exertion 11-13    Perceived Dyspnea 0-4      Progression   Progression Continue to progress workloads to maintain intensity without signs/symptoms of physical distress.      Resistance Training   Training Prescription Yes    Weight 3 lb    Reps 10-15             Perform Capillary Blood Glucose checks as needed.  Exercise Prescription Changes:   Exercise Prescription Changes     Row Name 07/29/21 1500 08/11/21 0700 08/12/21 1600 08/26/21 0800 09/09/21 1500     Response to Exercise   Blood Pressure (Admit) 108/64 -- 100/58 102/60 104/62   Blood Pressure (Exercise) 124/64 --  142/80 134/68 --   Blood Pressure (Exit) 112/66 -- 122/60 98/58 102/56   Heart Rate (Admit) 63 bpm -- 73 bpm 87 bpm 86 bpm   Heart Rate (Exercise) 81 bpm -- 117 bpm 141 bpm 140 bpm   Heart Rate (Exit) 61 bpm -- 80 bpm 86 bpm 95 bpm   Oxygen Saturation (Admit) 97 % -- -- -- --   Oxygen Saturation (Exercise) 99 % -- -- -- --   Rating of Perceived Exertion (Exercise) 7 -- 11 13 16    Perceived Dyspnea (Exercise) 0 -- -- -- --   Symptoms none -- none none none   Comments walk test results -- 4th full day of exercise -- --   Duration -- -- Continue with 30 min of aerobic exercise without signs/symptoms of physical distress. Continue with 30 min of aerobic exercise without signs/symptoms of physical distress. Continue with 30 min of aerobic exercise without signs/symptoms of physical distress.   Intensity -- -- THRR unchanged THRR unchanged THRR unchanged     Progression   Progression -- -- Continue to progress workloads to maintain intensity without signs/symptoms of physical distress. Continue to progress workloads to maintain intensity without signs/symptoms of physical distress. Continue to progress workloads to maintain intensity without signs/symptoms of physical distress.  Average METs -- -- 4.17 5.28 7.48     Resistance Training   Training Prescription -- -- Yes Yes Yes   Weight -- -- 4 lb 4 lb 5 lb   Reps -- -- 10-15 10-15 10-15     Interval Training   Interval Training -- -- No No Yes   Equipment -- -- -- -- Treadmill   Comments -- -- -- -- 66mn up 156m down with incline     Treadmill   MPH -- -- 3.5 4.5 3.5   Grade -- -- 1.5 4 15    Minutes -- -- 15 15 15    METs -- -- 4.4 6.93 10.9     NuStep   Level -- -- 2 6 6    Minutes -- -- 15 15 15    METs -- -- 5.2 6.1 7.2     T5 Nustep   Level -- -- 4 4 6    Minutes -- -- 15 15 15    METs -- -- 3.5 3.6 4.2     Home Exercise Plan   Plans to continue exercise at -- Home (comment)  treadmill, staff videos, walking Home (comment)   treadmill, staff videos, walking Home (comment)  treadmill, staff videos, walking Home (comment)  treadmill, staff videos, walking   Frequency -- Add 2 additional days to program exercise sessions. Add 2 additional days to program exercise sessions. Add 2 additional days to program exercise sessions. Add 2 additional days to program exercise sessions.   Initial Home Exercises Provided -- 08/11/21 08/11/21 08/11/21 08/11/21     Oxygen   Maintain Oxygen Saturation -- -- 88% or higher 88% or higher 88% or higher    Row Name 09/22/21 1400             Response to Exercise   Blood Pressure (Admit) 122/64       Blood Pressure (Exit) 100/70       Heart Rate (Admit) 62 bpm       Heart Rate (Exercise) 117 bpm       Heart Rate (Exit) 93 bpm       Oxygen Saturation (Admit) 98 %       Oxygen Saturation (Exercise) 97 %       Oxygen Saturation (Exit) 98 %       Rating of Perceived Exertion (Exercise) 15       Symptoms none       Duration Continue with 30 min of aerobic exercise without signs/symptoms of physical distress.       Intensity THRR unchanged         Progression   Progression Continue to progress workloads to maintain intensity without signs/symptoms of physical distress.       Average METs 7.75         Resistance Training   Training Prescription Yes       Weight 8 lb       Reps 10-15         Interval Training   Interval Training Yes       Equipment Treadmill       Comments 18m53mup 18mi79mown with incline         Treadmill   MPH 3.5       Grade 1       Minutes 515       METs 10.9         NuStep   Level 6       Minutes 15  Elliptical   Level 6       Speed 3.1       Minutes 15       METs 4.6         Home Exercise Plan   Plans to continue exercise at Home (comment)  treadmill, staff videos, walking       Frequency Add 2 additional days to program exercise sessions.       Initial Home Exercises Provided 08/11/21         Oxygen   Maintain Oxygen Saturation  88% or higher                Exercise Comments:   Exercise Comments     Row Name 08/04/21 4854 10/06/21 0805         Exercise Comments First full day of exercise!  Patient was oriented to gym and equipment including functions, settings, policies, and procedures.  Patient's individual exercise prescription and treatment plan were reviewed.  All starting workloads were established based on the results of the 6 minute walk test done at initial orientation visit.  The plan for exercise progression was also introduced and progression will be customized based on patient's performance and goals. Nathan Hammond graduated today from  rehab with 36 sessions completed.  Details of the patient's exercise prescription and what He needs to do in order to continue the prescription and progress were discussed with patient.  Patient was given a copy of prescription and goals.  Patient verbalized understanding.  Nathan Hammond plans to continue to exercise by going to [employer].               Exercise Goals and Review:   Exercise Goals     Row Name 07/29/21 1538             Exercise Goals   Increase Physical Activity Yes       Intervention Provide advice, education, support and counseling about physical activity/exercise needs.;Develop an individualized exercise prescription for aerobic and resistive training based on initial evaluation findings, risk stratification, comorbidities and participant's personal goals.       Expected Outcomes Short Term: Attend rehab on a regular basis to increase amount of physical activity.;Long Term: Add in home exercise to make exercise part of routine and to increase amount of physical activity.;Long Term: Exercising regularly at least 3-5 days a week.       Increase Strength and Stamina Yes       Intervention Provide advice, education, support and counseling about physical activity/exercise needs.;Develop an individualized exercise prescription for aerobic and resistive  training based on initial evaluation findings, risk stratification, comorbidities and participant's personal goals.       Expected Outcomes Short Term: Increase workloads from initial exercise prescription for resistance, speed, and METs.;Short Term: Perform resistance training exercises routinely during rehab and add in resistance training at home;Long Term: Improve cardiorespiratory fitness, muscular endurance and strength as measured by increased METs and functional capacity (6MWT)       Able to understand and use rate of perceived exertion (RPE) scale Yes       Intervention Provide education and explanation on how to use RPE scale       Expected Outcomes Short Term: Able to use RPE daily in rehab to express subjective intensity level;Long Term:  Able to use RPE to guide intensity level when exercising independently       Able to understand and use Dyspnea scale Yes  Intervention Provide education and explanation on how to use Dyspnea scale       Expected Outcomes Short Term: Able to use Dyspnea scale daily in rehab to express subjective sense of shortness of breath during exertion;Long Term: Able to use Dyspnea scale to guide intensity level when exercising independently       Knowledge and understanding of Target Heart Rate Range (THRR) Yes       Intervention Provide education and explanation of THRR including how the numbers were predicted and where they are located for reference       Expected Outcomes Short Term: Able to state/look up THRR;Long Term: Able to use THRR to govern intensity when exercising independently;Short Term: Able to use daily as guideline for intensity in rehab       Able to check pulse independently Yes       Intervention Provide education and demonstration on how to check pulse in carotid and radial arteries.;Review the importance of being able to check your own pulse for safety during independent exercise       Expected Outcomes Short Term: Able to explain why pulse  checking is important during independent exercise;Long Term: Able to check pulse independently and accurately       Understanding of Exercise Prescription Yes       Intervention Provide education, explanation, and written materials on patient's individual exercise prescription       Expected Outcomes Short Term: Able to explain program exercise prescription;Long Term: Able to explain home exercise prescription to exercise independently                Exercise Goals Re-Evaluation :  Exercise Goals Re-Evaluation     Row Name 08/04/21 0814 08/11/21 0753 08/12/21 1635 08/26/21 0827 09/09/21 1523     Exercise Goal Re-Evaluation   Exercise Goals Review Understanding of Exercise Prescription;Knowledge and understanding of Target Heart Rate Range (THRR);Able to understand and use Dyspnea scale;Able to understand and use rate of perceived exertion (RPE) scale Understanding of Exercise Prescription;Knowledge and understanding of Target Heart Rate Range (THRR);Able to understand and use Dyspnea scale;Able to understand and use rate of perceived exertion (RPE) scale;Increase Physical Activity;Increase Strength and Stamina;Able to check pulse independently Increase Physical Activity;Increase Strength and Stamina;Understanding of Exercise Prescription Increase Physical Activity;Increase Strength and Stamina;Understanding of Exercise Prescription Increase Physical Activity;Increase Strength and Stamina;Understanding of Exercise Prescription   Comments Reviewed RPE and dyspnea scales, THR and program prescription with pt today.  Pt voiced understanding and was given a copy of goals to take home.       Short: Use RPE daily to regulate intensity. Long: Follow program prescription in THR. Reviewed home exercise with pt today.  Pt plans to walk and use treadmill at home for exercise.  We also talked about using staff videos as an option.  Reviewed THR, pulse, RPE, sign and symptoms, pulse oximetery and when to call  911 or MD.  Also discussed weather considerations and indoor options.  Pt voiced understanding. Nathan Hammond is doing well for the first couple of weeks he has been here. He has already increased most of his workloads, including T4 Nustep to level 5, T5 to level 4, and treadmill to 3.5 mph/ 1.5%. He continues to reach his THR. We will continue to monitor as he progresses in the program. Nathan Hammond is doing well in rehab. He is up to 4.5 mph and 4.0% incline on the treadmill. He also improved to level 6 on the T4 machine. Nathan Hammond increased his  average MET level up to 5.28 METs as well. We will continue to monitor his progress in the program. Nathan Hammond continues to do well in rehab.  He has started to add in intervals on the treadmill with incline.  We will encourage him to try intervals on the seated equipment too.  We will continue to montior his progress.   Expected Outcomes Short: Use RPE daily to regulate intensity. Long: Follow program prescription in THR. Short: Start to add in treadmill at home Long: Continue to exercise independently Short: Continue to increase workload on treadmill Long: Continue to increase overall MET level Short: try 5 lb weights for resistance training. Long: Continue to increase strength and stamina. Short: Add intervals to NuStep  Long: Continue to push with intervals    Row Name 09/17/21 0718 09/22/21 1449           Exercise Goal Re-Evaluation   Exercise Goals Review Increase Physical Activity;Increase Strength and Stamina;Understanding of Exercise Prescription Increase Physical Activity;Increase Strength and Stamina;Understanding of Exercise Prescription      Comments Nathan Hammond feels that exercise is going well for him. He feels he has made many improvements to his endurance and breathing. On his days off from the program he is walking around 45 minutes a day and is doing some resistance training as well with weights up to 10 lb. Nathan Hammond improved his 6MWT by 645 ft!!  He was pleased with his improvement.  He  is up to 15% grade on the treadmill.  We will continue to monitor his progress.      Expected Outcomes Short: Continue to walk at home. Long: Continue to improve strength and stamina. Short: Continue to work toward graduation Long: Continue to improve stamina               Discharge Exercise Prescription (Final Exercise Prescription Changes):  Exercise Prescription Changes - 09/22/21 1400       Response to Exercise   Blood Pressure (Admit) 122/64    Blood Pressure (Exit) 100/70    Heart Rate (Admit) 62 bpm    Heart Rate (Exercise) 117 bpm    Heart Rate (Exit) 93 bpm    Oxygen Saturation (Admit) 98 %    Oxygen Saturation (Exercise) 97 %    Oxygen Saturation (Exit) 98 %    Rating of Perceived Exertion (Exercise) 15    Symptoms none    Duration Continue with 30 min of aerobic exercise without signs/symptoms of physical distress.    Intensity THRR unchanged      Progression   Progression Continue to progress workloads to maintain intensity without signs/symptoms of physical distress.    Average METs 7.75      Resistance Training   Training Prescription Yes    Weight 8 lb    Reps 10-15      Interval Training   Interval Training Yes    Equipment Treadmill    Comments 81mn up 19m down with incline      Treadmill   MPH 3.5    Grade 1    Minutes 515    METs 10.9      NuStep   Level 6    Minutes 15      Elliptical   Level 6    Speed 3.1    Minutes 15    METs 4.6      Home Exercise Plan   Plans to continue exercise at Home (comment)   treadmill, staff videos, walking   Frequency Add 2  additional days to program exercise sessions.    Initial Home Exercises Provided 08/11/21      Oxygen   Maintain Oxygen Saturation 88% or higher             Nutrition:  Target Goals: Understanding of nutrition guidelines, daily intake of sodium <1563m, cholesterol <201m calories 30% from fat and 7% or less from saturated fats, daily to have 5 or more servings of fruits  and vegetables.  Education: All About Nutrition: -Group instruction provided by verbal, written material, interactive activities, discussions, models, and posters to present general guidelines for heart healthy nutrition including fat, fiber, MyPlate, the role of sodium in heart healthy nutrition, utilization of the nutrition label, and utilization of this knowledge for meal planning. Follow up email sent as well. Written material given at graduation. [facility]  Education need identified 07/29/21  Date 09/24/21  Educator [place] Code 1- Verbalizes Understanding       Biometrics:  Pre Biometrics - 07/29/21 1532       Pre Biometrics   Height 5' 9"  (1.753 m)    Weight 196 lb 6.4 oz (89.1 kg)    BMI (Calculated) 28.99    Single Leg Stand 30 seconds             Post Biometrics - 09/22/21 0732        Post  Biometrics   Height 5' 9"  (1.753 m)    Weight 199 lb 9.6 oz (90.5 kg)    BMI (Calculated) 29.46    Single Leg Stand 30 seconds             Nutrition Therapy Plan and Nutrition Goals:  Nutrition Therapy & Goals - 08/11/21 0753       Nutrition Therapy   RD appointment deferred Yes   Pt would not like to meet with RD at this time. Will continue to follow up.     Personal Nutrition Goals   Nutrition Goal Pt would not like to meet with RD at this time. Will continue to follow up.             Nutrition Assessments:  MEDIFICTS Score Key: ?70 Need to make dietary changes  40-70 Heart Healthy Diet ? 40 Therapeutic Level Cholesterol Diet  [facility]  Picture Your Plate Total Score on Admission 51  Picture Your Plate Total Score on Discharge 57      Picture Your Plate Scores: <4<61nhealthy dietary pattern with much room for improvement. 41-50 Dietary pattern unlikely to meet recommendations for good health and  room for improvement. 51-60 More healthful dietary pattern, with some room for improvement.  >60 Healthy dietary pattern, although there may be some specific behaviors that could be improved.    Nutrition Goals Re-Evaluation:  Nutrition Goals Re-Evaluation     [place] 08/27/21 0735 09/17/21 0725           Goals   Current Weight 195 lb (88.5 kg) --      Nutrition Goal Pt would not like to meet with RD at this time. Will continue to follow up. Pt would not like to meet with RD at this time. Will continue to follow up.      Comment -- Nathan Hammond states that he has lost over 100 lbs in the last year on his current diet. He does not have any dietary concerns  at this time, and has deferred to meet with the RD at this time.      Expected Outcome -- Short: continue healthy dietary choices. Long: continue weight loss through eating patterns.               Nutrition Goals Discharge (Final Nutrition Goals Re-Evaluation):  Nutrition Goals Re-Evaluation - 09/17/21 0725       Goals   Nutrition Goal Pt would not like to meet with RD at this time. Will continue to follow up.    Comment Nathan Hammond states that he has lost over 100 lbs in the last year on his current diet. He does not have any dietary concerns at this time, and has deferred to meet with the RD at this time.    Expected Outcome Short: continue healthy dietary choices. Long: continue weight loss through eating patterns.             Psychosocial: Target Goals: Acknowledge presence or absence of significant depression and/or stress, maximize coping skills, provide positive support system. Participant is able to verbalize types and ability to use techniques and skills needed for reducing stress and depression.   Education: Stress, Anxiety, and Depression - Group verbal and visual presentation to define topics covered.  Reviews how body is impacted by stress, anxiety, and depression.  Also discusses healthy ways to reduce stress and to  treat/manage anxiety and depression.  Written material given at graduation. Flowsheet Row Cardiac Rehab from 10/01/2021 in [facility] Cardiac and Pulmonary Rehab  Date 08/20/21  Educator [facility]  Instruction Review Code 1- [employer] Understanding       Education: Sleep Hygiene -Provides group verbal and written instruction about how sleep can affect your health.  Define sleep hygiene, discuss sleep cycles and impact of sleep habits. Review good sleep hygiene tips.    Initial Review & Psychosocial Screening:  Initial Psych Review & Screening - 07/18/21 1315       Initial Review   Current issues with None Identified      Family Dynamics   Good Support System? Yes   wife     Barriers   Psychosocial barriers to participate in program There are no identifiable barriers or psychosocial needs.;The patient should benefit from training in stress management and relaxation.      Screening Interventions   Interventions Encouraged to exercise    Expected Outcomes Short Term goal: Utilizing psychosocial counselor, staff and physician to assist with identification of specific Stressors or current issues interfering with healing process. Setting desired goal for each stressor or current issue identified.;Long Term Goal: Stressors or current issues are controlled or eliminated.;Short Term goal: Identification and review with participant of any Quality of Life or Depression concerns found by scoring the questionnaire.;Long Term goal: The participant improves quality of Life and PHQ9 Scores as seen by post scores and/or verbalization of changes             Quality of Life Scores:   Quality of Life - 09/26/21 0736       Quality of Life   Select Quality of Life      Quality of Life Scores   Health/Function Pre 27.13 %    Health/Function Post 25.2 %    Health/Function % Change -7.11 %    Socioeconomic Pre 25.56 %    Socioeconomic Post 22.93 %    Socioeconomic % Change  -10.29 %    Psych/Spiritual Pre  26.57 %    Psych/Spiritual Post 26.57 %  Psych/Spiritual % Change 0 %    Family Pre 25.2 %    Family Post 17.75 %    Family % Change -29.56 %    GLOBAL Pre 26.39 %    GLOBAL Post 24.11 %    GLOBAL % Change -8.64 %            Scores of 19 and below usually indicate a poorer quality of life in these areas.  A difference of  2-3 points is a clinically meaningful difference.  A difference of 2-3 points in the total score of the Quality of Life Index has been associated with significant improvement in overall quality of life, self-image, physical symptoms, and general health in studies assessing change in quality of life.  PHQ-9: Review Flowsheet       09/26/2021 08/27/2021 07/29/2021  Depression screen PHQ 2/9  Decreased Interest 0 0 1  Down, Depressed, Hopeless 0 1 1  PHQ - 2 Score 0 1 2  Altered sleeping 1 0 2  Tired, decreased energy 0 0 1  Change in appetite 0 0 0  Feeling bad or failure about yourself  0 3 1  Trouble concentrating 0 0 0  Moving slowly or fidgety/restless 0 0 0  Suicidal thoughts 0 0 1  PHQ-9 Score 1 4 7   Difficult doing work/chores Not difficult at all Not difficult at all Somewhat difficult   Interpretation of Total Score  Total Score Depression Severity:  1-4 = Minimal depression, 5-9 = Mild depression, 10-14 = Moderate depression, 15-19 = Moderately severe depression, 20-27 = Severe depression   Psychosocial Evaluation and Intervention:  Psychosocial Evaluation - 07/18/21 1335       Psychosocial Evaluation & Interventions   Comments Nathan Hammond has no barriers toattending the program. He lives wit his wife.She is is support. He has been on a weight loss journey since last May and has loss over 100 pounds to date. All by following  the [place]. His goal is to lose more weight and to get off his cholesterol medication. He is ready to start the program and continue to heal and learn more about being heart healthy..    Expected Outcomes STG Attends all the  scheduled sessions, attends education sessions too. LTG Continues to progress with his exercise and weight loss goals.             Psychosocial Re-Evaluation:  Psychosocial Re-Evaluation     [place] Name 08/27/21 0732 09/17/21 0722           Psychosocial Re-Evaluation   Current issues with Current Stress Concerns None Identified      Comments Reviewed patient health questionnaire (PHQ-9) with patient for follow up. Previously, patients score indicated signs/symptoms of depression.  Reviewed to see if patient is improving symptom wise while in program.  Score improved and patient states that it is because he is feeling much better with his health. He has some stress due to not being able to work and is worried about the bills he is going to get. Nathan Hammond denies any major stressors at this time. He does state that he is doing a lot better and feels that exercise has been a good stress reliever for him. He is not concerned with any stress related issues at this time.      Expected Outcomes Short: Continue to attend [facility] regularly for regular exercise and social engagement. Long: Continue to improve symptoms and manage a positive mental state. Short: Continue to exercise for stress relief. Long:  Continue to keep a positive outlook.      Interventions Encouraged to attend Cardiac Rehabilitation for the exercise --      Continue Psychosocial Services  No Follow up required Follow up required by staff        Initial Review   Source of Stress Concerns Financial None Identified               Psychosocial Discharge (Final Psychosocial Re-Evaluation):  Psychosocial Re-Evaluation - 09/17/21 4098       Psychosocial Re-Evaluation   Current issues with None Identified    Comments Nathan Hammond denies any major stressors at this time. He does state that he is doing a lot better and feels that exercise has been a good stress reliever for him. He is not concerned with any stress related issues at  this time.    Expected Outcomes Short: Continue to exercise for stress relief. Long: Continue to keep a positive outlook.    Continue Psychosocial Services  Follow up required by staff      Initial Review   Source of Stress Concerns None Identified             Vocational Rehabilitation: Provide vocational rehab assistance to qualifying candidates.   Vocational Rehab Evaluation & Intervention:  Vocational Rehab - 09/26/21 0746       Discharge Vocational Rehab   Discharge Vocational Rehabilitation no VR requestede             Education: Education Goals: Education classes will be provided on a variety of topics geared toward better understanding of heart health and risk factor modification. Participant will state understanding/return demonstration of topics presented as noted by education test scores.  Learning Barriers/Preferences:   General Cardiac Education Topics:  AED/CPR: - Group verbal and written instruction with the use of models to demonstrate the basic use of the AED with the basic ABC's of resuscitation.   Anatomy and Cardiac Procedures: - Group verbal and visual presentation and models provide information about basic cardiac anatomy and function. Reviews the testing methods done to diagnose heart disease and the outcomes of the test results. Describes the treatment choices: Medical Management, Angioplasty, or Coronary Bypass Surgery for treating various heart conditions including Myocardial Infarction, Angina, Valve Disease, and Cardiac Arrhythmias.  Written material given at graduation. Flowsheet Row Cardiac Rehab from 10/01/2021 in [facility] Cardiac and Pulmonary Rehab  Education need identified 07/29/21  Date 09/10/21  Educator SB  Instruction Review Code 1- Verbalizes Understanding       Medication Safety: - Group verbal and visual instruction to review commonly prescribed medications for heart and lung disease. Reviews the medication, class of the drug,  and side effects. Includes the steps to properly store meds and maintain the prescription regimen.  Written material given at graduation.   Intimacy: - Group verbal instruction through game format to discuss how heart and lung disease can affect sexual intimacy. Written material given at graduation.. Flowsheet Row Cardiac Rehab from 10/01/2021 in [facility] Cardiac and Pulmonary Rehab  Date 09/03/21  Educator [facility]  Instruction Review Code 1- Verbalizes Understanding       Know Your Numbers and Heart Failure: - Group verbal and visual instruction to discuss disease risk factors for cardiac and pulmonary disease and treatment options.  Reviews associated critical values for Overweight/Obesity, Hypertension, Cholesterol, and Diabetes.  Discusses basics of heart failure: signs/symptoms and treatments.  Introduces Heart Failure Zone chart for action plan for heart failure.  Written material given at graduation. Flowsheet  [facility]  Education need identified 07/29/21  Date 08/06/21  Educator SB  Instruction Review Code 1- Verbalizes Understanding       Infection Prevention: - Provides verbal and written material to individual with discussion of infection control including proper hand washing and proper equipment cleaning during exercise session. Flowsheet Row Cardiac Rehab from 10/01/2021 in [facility] Cardiac and Pulmonary Rehab  Education need identified 07/29/21  Date 07/29/21  Educator [place]  Instruction Review Code 1- Verbalizes Understanding       Falls Prevention: - Provides verbal and written material to individual with discussion of falls prevention and safety. Flowsheet Row Cardiac Rehab from 10/01/2021 in [facility] Cardiac and Pulmonary Rehab  Education need identified 07/29/21  Date 07/29/21  Educator [place]  Instruction Review Code 1- Verbalizes Understanding       Other: -Provides group and verbal instruction on various topics (see  comments)   Knowledge Questionnaire Score:  Knowledge Questionnaire Score - 09/26/21 0746       Knowledge Questionnaire Score   Pre Score 21/16    Post Score 19/26             Core Components/Risk Factors/Patient Goals at Admission:  Personal Goals and Risk Factors at Admission - 07/29/21 1538       Core Components/Risk Factors/Patient Goals on Admission    Weight Management Yes;Weight Loss    Intervention Weight Management: Develop a combined nutrition and exercise program designed to reach desired caloric intake, while maintaining appropriate intake of nutrient and fiber, sodium and fats, and appropriate energy expenditure required for the weight goal.;Weight Management: Provide education and appropriate resources to help participant work on and attain dietary goals.;Weight Management/Obesity: Establish reasonable short term and long term weight goals.    Admit Weight 196 lb (88.9 kg)    Goal Weight: Short Term 190 lb (86.2 kg)    Goal Weight: Long Term 175 lb (79.4 kg)   Per patient. Currently following Keto the last year   Expected Outcomes Long Term: Adherence to nutrition and physical activity/exercise program aimed toward attainment of established weight goal;Short Term: Continue to assess and modify interventions until short term weight is achieved;Weight Loss: Understanding of general recommendations for a balanced deficit meal plan, which promotes 1-2 lb weight loss per week and includes a negative energy balance of [id number] kcal/d;Understanding recommendations for meals to include 15-35% energy as protein, 25-35% energy from fat, 35-60% energy from carbohydrates, less than 28m of dietary cholesterol, 20-35 gm of total fiber daily;Understanding of distribution of calorie intake throughout the day with the consumption of 4-5 meals/snacks    Hypertension Yes    Intervention Provide education on lifestyle modifcations including regular physical activity/exercise, weight  management, moderate sodium restriction and increased consumption of fresh fruit, vegetables, and low fat dairy, alcohol moderation, and smoking cessation.;Monitor prescription use compliance.    Expected Outcomes Short Term: Continued assessment and intervention until BP is < 140/92mHG in hypertensive participants. < 130/8060mG in hypertensive participants with diabetes, heart failure or chronic kidney disease.;Long Term: Maintenance of blood pressure at goal levels.    Lipids Yes    Intervention Provide education and support for participant on nutrition & aerobic/resistive exercise along with prescribed medications to achieve LDL <58m36mDL >40mg63m Expected Outcomes Short Term: Participant states understanding of desired cholesterol values and is compliant with medications prescribed. Participant is following exercise prescription and nutrition guidelines.;Long Term: Cholesterol controlled with medications as  prescribed, with individualized exercise RX and with personalized nutrition plan. Value goals: LDL < 43m, HDL > 40 mg.             Education:Diabetes - Individual verbal and written instruction to review signs/symptoms of diabetes, desired ranges of glucose level fasting, after meals and with exercise. Acknowledge that pre and post exercise glucose checks will be done for 3 sessions at entry of program.   Core Components/Risk Factors/Patient Goals Review:   Goals and Risk Factor Review     Row Name 08/27/21 0736 09/17/21 0729           Core Components/Risk Factors/Patient Goals Review   Personal Goals Review Weight Management/Obesity Weight Management/Obesity      Review Nathan Hammond still wants to get his weight down to 175lbs. He feels like he would feel his best if he was that weight. He wants to lose it in a healthy manner. He is eating well and feels like he does not need to change his diet. Nathan Hammond still wants to get his weight down to 175lbs. He feels like he would feel his best if  he was at that weight. He states that he is currently stuck at 195 lbs but wants to lose the rest of the weight in a healthy manner. He is eating well and feels like he does not need to change his diet.      Expected Outcomes Short: lose more weight. Long: reach weight goal. Short: continue to attend rehab for weight loss benefits Long: reach weight goal.               Core Components/Risk Factors/Patient Goals at Discharge (Final Review):   Goals and Risk Factor Review - 09/17/21 0729       Core Components/Risk Factors/Patient Goals Review   Personal Goals Review Weight Management/Obesity    Review Nathan Hammond still wants to get his weight down to 175lbs. He feels like he would feel his best if he was at that weight. He states that he is currently stuck at 195 lbs but wants to lose the rest of the weight in a healthy manner. He is eating well and feels like he does not need to change his diet.    Expected Outcomes Short: continue to attend rehab for weight loss benefits Long: reach weight goal.             ITP Comments:  ITP Comments     Row Name 07/18/21 1338 07/29/21 1511 08/04/21 0814 08/13/21 1320 09/10/21 0956   ITP Comments Virtual orientation call completed today. he has an appointment on Date: 07/29/2021  for EP eval and gym Orientation.  [facility] . Completed 6MWT and gym orientation. Initial ITP created and sent for review to Dr. BRamonita Lab Medical Director. First full day of exercise!  Patient was oriented to gym and equipment including functions, settings, policies, and procedures.  Patient's individual exercise prescription and treatment plan were reviewed.  All starting workloads were established based on the results of the 6 minute walk test done at initial orientation visit.  The plan for exercise progression was also introduced and progression will be customized based on patient's performance and goals. 30 Day review  completed. Medical Director ITP review done, changes made as directed, and signed approval by Medical Director. 30 Day review completed. Medical Director ITP review done, changes made as directed, and signed approval by Medical Director.    RBox ElderName 10/06/21 [phone]  ITP Comments Nathan Hammond graduated today from  rehab with 36 sessions completed.  Details of the patient's exercise prescription and what He needs to do in order to continue the prescription and progress were discussed with patient.  Patient was given a copy of prescription and goals.  Patient verbalized understanding.  Nathan Hammond plans to continue to exercise by going to [employer].                Comments: Discharge ITP

## 2021-10-06 NOTE — Progress Notes (Signed)
Discharge Note   Nathan Hammond   January 07, 1966  Nathan Hammond graduated today from  rehab with 36 sessions completed.  Details of the patient's exercise prescription and what He needs to do in order to continue the prescription and progress were discussed with patient.  Patient was given a copy of prescription and goals.  Patient verbalized understanding.  Nathan Hammond plans to continue to exercise by going to [employer].   6 Minute Walk     Row Name 07/29/21 1533 09/22/21 0731       6 Minute Walk   Phase Initial Discharge    Distance 1415 feet 2060 feet    Distance % Change -- 45.58 %    Distance Feet Change -- 645 ft    Walk Time 6 minutes 6 minutes    # of Rest Breaks 0 0    MPH 2.67 3.9    METS 3.52 5.06    RPE 7 13    Perceived Dyspnea  0 --    VO2 Peak 12.32 17.7    Symptoms No No    Resting HR 63 bpm 82 bpm    Resting BP 108/64 122/72    Resting Oxygen Saturation  97 % 98 %    Exercise Oxygen Saturation  during 6 min walk 99 % 98 %    Max Ex. HR 81 bpm 121 bpm    Max Ex. BP 124/64 132/70    2 Minute Post BP 112/66 --            Thank you for the referral. We enjoyed working with Nathan Hammond.

## 2021-10-06 NOTE — Progress Notes (Signed)
Daily Session Note  Patient Details  Name: Nathan Hammond MRN: [id number] Date of Birth: 11-07-1965 Referring Provider:   Flowsheet Row Cardiac Rehab from 07/29/2021 in [facility] Cardiac and Pulmonary Rehab  Referring Provider Kate Sable MD       Encounter Date: 10/06/2021  Check In:  Session Check In - 10/06/21 0802       Check-In   Supervising physician immediately available to respond to emergencies See telemetry face sheet for immediately available ER MD    Location [facility]-Cardiac & Pulmonary Rehab    Staff Present Heath Lark, RN, BSN, Jacklynn Bue, MS, ASCM CEP, Exercise Physiologist;Kelly Amedeo Plenty, BS, ACSM CEP, Exercise Physiologist    Virtual Visit No    Medication changes reported     No    Fall or balance concerns reported    No    Warm-up and Cool-down Performed on first and last piece of equipment    Resistance Training Performed Yes    VAD Patient? No    PAD/SET Patient? No      Pain Assessment   Currently in Pain? No/denies                Social History   Tobacco Use  Smoking Status Never  Smokeless Tobacco Never    Goals Met:  Independence with exercise equipment Exercise tolerated well No report of concerns or symptoms today  Goals Unmet:  Not Applicable  Comments:  [name] graduated today from  rehab with 36 sessions completed.  Details of the patient's exercise prescription and what He needs to do in order to continue the prescription and progress were discussed with patient.  Patient was given a copy of prescription and goals.  Patient verbalized understanding.  [name] plans to continue to exercise by going to [employer].    Dr. Emily Filbert is Medical Director for [place].  Dr. Ottie Glazier is Medical Director for [facility] Pulmonary Rehabilitation.

## 2021-11-21 ENCOUNTER — Other Ambulatory Visit: Payer: Self-pay | Admitting: *Deleted

## 2021-11-21 MED ORDER — ROSUVASTATIN CALCIUM 40 MG PO TABS: 40.0000 mg | ORAL_TABLET | Freq: Every day | ORAL | 3 refills | Status: DC

## 2022-02-09 ENCOUNTER — Other Ambulatory Visit: Payer: Self-pay

## 2022-02-09 MED ORDER — METOPROLOL SUCCINATE ER 25 MG PO TB24: 12.5000 mg | ORAL_TABLET | Freq: Every day | ORAL | 0 refills | Status: DC

## 2022-02-20 ENCOUNTER — Encounter: Payer: Self-pay | Admitting: Cardiology

## 2022-02-20 ENCOUNTER — Ambulatory Visit: Payer: 59 | Attending: Cardiology | Admitting: Cardiology

## 2022-02-20 VITALS — BP 128/72 | HR 75 | Ht 68.0 in | Wt 204.8 lb

## 2022-02-20 DIAGNOSIS — I10 Essential (primary) hypertension: Secondary | ICD-10-CM | POA: Diagnosis not present

## 2022-02-20 DIAGNOSIS — Z951 Presence of aortocoronary bypass graft: Secondary | ICD-10-CM

## 2022-02-20 DIAGNOSIS — E7800 Pure hypercholesterolemia, unspecified: Secondary | ICD-10-CM | POA: Diagnosis not present

## 2022-02-20 NOTE — Patient Instructions (Signed)
Medication Instructions:  No changes *If you need a refill on your cardiac medications before your next appointment, please call your pharmacy*   Lab Work: None ordered If you have labs (blood work) drawn today and your tests are completely normal, you will receive your results only by: MyChart Message (if you have MyChart) OR A paper copy in the mail If you have any lab test that is abnormal or we need to change your treatment, we will call you to review the results.   Testing/Procedures: None ordered   Follow-Up: At [facility], you and your health needs are our priority.  As part of our continuing mission to provide you with exceptional heart care, we have created designated Provider Care Teams.  These Care Teams include your primary Cardiologist (physician) and Advanced Practice Providers (APPs -  Physician Assistants and Nurse Practitioners) who all work together to provide you with the care you need, when you need it.  We recommend signing up for the patient portal called "MyChart".  Sign up information is provided on this After Visit Summary.  MyChart is used to connect with patients for Virtual Visits (Telemedicine).  Patients are able to view lab/test results, encounter notes, upcoming appointments, etc.  Non-urgent messages can be sent to your provider as well.   To learn more about what you can do with MyChart, go to [email or web address].    Your next appointment:   12 month(s)  The format for your next appointment:   In Person  Provider:   You may see Brian Agbor-Etang, MD or one of the following Advanced Practice Providers on your designated Care Team:   Christopher Berge, NP Ryan Dunn, PA-C Cadence Furth, PA-C Sheri Hammock, NP      

## 2022-02-20 NOTE — Progress Notes (Signed)
Cardiology Office Note:    Date:  02/20/2022   ID:  Nathan Hammond, DOB 06/22/1965, MRN [id number]  PCP:  Cleon Dew, FNP   [facility] Providers Cardiologist:  Debbe Odea, MD     Referring MD: Cleon Dew, FNP   Chief Complaint  Patient presents with   Follow-up    6 month follow up visit. Patient states that he feels good today. Meds reviewed with patient    History of Present Illness:    Nathan Hammond is a 56 y.o. male with a hx of CAD/CABG x3 (LIMA- LAD, SVG-OM, SVG RCA in 05/2021), hypertension, hyperlipidemia who presents for follow-up.  Underwent cardiac rehab successfully, tolerating all medications as prescribed.  Denies any significant chest pain or shortness of breath.  Sometimes have mild chest pressure but is otherwise okay.  Blood pressures have been controlled on current dose of metoprolol.  States feeling well, went back to work, no sternal discomfort.  No new concerns at this time.   Prior notes Echo 04/2021 EF 60 to 65% Left heart cath 04/2021 90% mid LAD, 100% RCA, 80% left circumflex    Past Medical History:  Diagnosis Date   Coronary artery disease    a. 04/2021 Cath: LM nl, LAD 30ost/p, 90p/m, 77m, LCX 69m/d, OM1/2/3 nl, RCA 190m, RPDA fills via L->R collats from dLAD, EF 55-65%; b. 05/2021 CABG x 3: LIMA->LAD, VG->OM, VG->RCA.   Essential hypertension    History of echocardiogram    a. 04/2021 Echo: EF 60-65%, no rwma, nl RV fxn.   Hyperlipidemia LDL goal <70     Past Surgical History:  Procedure Laterality Date   CORONARY ARTERY BYPASS GRAFT N/A 06/09/2021   Procedure: CORONARY ARTERY BYPASS GRAFTING X3 USING LEFT INTERNAL MAMMORY ARTERY AND RIGHT GREATER SAPHENOUS VEIN HARVESTED ENDOSCOPICALLY;  Surgeon: Lovett Sox, MD;  Location: [facility] OR;  Service: Open Heart Surgery;  Laterality: N/A;   LEFT HEART CATH AND CORONARY ANGIOGRAPHY N/A 05/07/2021   Procedure: LEFT HEART CATH AND CORONARY ANGIOGRAPHY;  Surgeon: Iran Ouch, MD;  Location: [facility] INVASIVE CV LAB;  Service: Cardiovascular;  Laterality: N/A;   TEE WITHOUT CARDIOVERSION N/A 06/09/2021   Procedure: TRANSESOPHAGEAL ECHOCARDIOGRAM (TEE);  Surgeon: Lovett Sox, MD;  Location: [facility] OR;  Service: Open Heart Surgery;  Laterality: N/A;    Current Medications: Current Meds  Medication Sig   Ascorbic Acid (VITAMIN C) 1000 MG tablet Take 1,000 mg by mouth daily.   aspirin EC 325 MG EC tablet Take 1 tablet (325 mg total) by mouth daily.   Coenzyme Q10 (COQ-10) 100 MG CAPS Take 100 mg by mouth daily.   ezetimibe (ZETIA) 10 MG tablet Take 1 tablet (10 mg total) by mouth daily.   Menaquinone-7 (K2 PO) Take 1 tablet by mouth daily.   metoprolol succinate (TOPROL XL) 25 MG 24 hr tablet Take 0.5 tablets (12.5 mg total) by mouth daily.   Multiple Vitamin (MULTIVITAMIN WITH MINERALS) TABS tablet Take 1 tablet by mouth daily.   OMEGA-3 FATTY ACIDS PO Take 1 capsule by mouth in the morning.   rosuvastatin (CRESTOR) 40 MG tablet Take 1 tablet (40 mg total) by mouth daily.     Allergies:   Morphine   Social History   Socioeconomic History   Marital status: Single    Spouse name: Not on file   Number of children: Not on file   Years of education: Not on file   Highest education level: Not on file  Occupational History   Not  on file  Tobacco Use   Smoking status: Never   Smokeless tobacco: Never  Vaping Use   Vaping Use: Never used  Substance and Sexual Activity   Alcohol use: Yes    Comment: rare   Drug use: Never   Sexual activity: Not on file  Other Topics Concern   Not on file  Social History Narrative   Not on file   Social Determinants of Health   Financial Resource Strain: Not on file  Food Insecurity: Not on file  Transportation Needs: Not on file  Physical Activity: Not on file  Stress: Not on file  Social Connections: Not on file     Family History: The patient's family history includes Heart attack in his father.  ROS:   Please  see the history of present illness.     All other systems reviewed and are negative.  EKGs/Labs/Other Studies Reviewed:    The following studies were reviewed today:   EKG:  EKG is ordered today.  EKG shows normal sinus rhythm, normal ECG.  Recent Labs: 06/10/2021: Magnesium 2.4 06/12/2021: BUN 15; Creatinine, Ser 0.98; Hemoglobin 10.4; Platelets 192; Potassium 4.2; Sodium 139 07/10/2021: ALT 21  Recent Lipid Panel    Component Value Date/Time   CHOL 108 07/10/2021 0812   TRIG 57 07/10/2021 0812   HDL 50 07/10/2021 0812   CHOLHDL 2.2 07/10/2021 0812   VLDL 11 07/10/2021 0812   LDLCALC 47 07/10/2021 0812     Risk Assessment/Calculations:          Physical Exam:    VS:  BP 128/72 (BP Location: Left Arm, Patient Position: Sitting, Cuff Size: Normal)   Pulse 75   Ht 5\' 8"  (1.727 m)   Wt 204 lb 12.8 oz (92.9 kg)   SpO2 98%   BMI 31.14 kg/m     Wt Readings from Last 3 Encounters:  02/20/22 204 lb 12.8 oz (92.9 kg)  09/22/21 199 lb 9.6 oz (90.5 kg)  09/01/21 199 lb 1.6 oz (90.3 kg)     GEN:  Well nourished, well developed in no acute distress HEENT: Normal NECK: No JVD; No carotid bruits CARDIAC: RRR, no murmurs, rubs, gallops RESPIRATORY:  Clear to auscultation without rales, wheezing or rhonchi  ABDOMEN: Soft, non-tender, non-distended MUSCULOSKELETAL:  No edema; surgical/sternal scar appears well-healed SKIN: Warm and dry NEUROLOGIC:  Alert and oriented x 3 PSYCHIATRIC:  Normal affect   ASSESSMENT:    1. S/P CABG x 3   2. Primary hypertension   3. Pure hypercholesterolemia    PLAN:    In order of problems listed above:  S/p CABG x3, very mild chest pressure.  Otherwise doing okay.  Continue Toprol-XL at current dose, continue uptitrating Toprol-XL chest pain becomes worse.. Continue aspirin, Crestor, Zetia. Hypertension, BP controlled.  Continue Toprol-XL 12.5 mg daily. Hyperlipidemia, LDL at goal, continue Crestor, Zetia.  Follow-up in 12 months.       Medication Adjustments/Labs and Tests Ordered: Current medicines are reviewed at length with the patient today.  Concerns regarding medicines are outlined above.  Orders Placed This Encounter  Procedures   EKG 12-Lead   No orders of the defined types were placed in this encounter.   Patient Instructions  Medication Instructions:  No changes *If you need a refill on your cardiac medications before your next appointment, please call your pharmacy*   Lab Work: None ordered If you have labs (blood work) drawn today and your tests are completely normal, you will receive your results  only by: [employer] (if you have MyChart) OR A paper copy in the mail If you have any lab test that is abnormal or we need to change your treatment, we will call you to review the results.   Testing/Procedures: None ordered   Follow-Up: At [facility], you and your health needs are our priority.  As part of our continuing mission to provide you with exceptional heart care, we have created designated Provider Care Teams.  These Care Teams include your primary Cardiologist (physician) and Advanced Practice Providers (APPs -  Physician Assistants and Nurse Practitioners) who all work together to provide you with the care you need, when you need it.  We recommend signing up for the patient portal called "MyChart".  Sign up information is provided on this After Visit Summary.  MyChart is used to connect with patients for Virtual Visits (Telemedicine).  Patients are able to view lab/test results, encounter notes, upcoming appointments, etc.  Non-urgent messages can be sent to your provider as well.   To learn more about what you can do with MyChart, go to [email or web address].    Your next appointment:   12 month(s)  The format for your next appointment:   In Person  Provider:   You may see Debbe Odea, MD or one of the following Advanced Practice Providers on your designated Care  Team:   Nicolasa Ducking, NP Eula Listen, PA-C Cadence Fransico Michael, PA-C Charlsie Quest, NP        Signed, Debbe Odea, MD  02/20/2022 9:05 AM    [facility]

## 2022-03-13 ENCOUNTER — Other Ambulatory Visit: Payer: Self-pay

## 2022-03-13 MED ORDER — ROSUVASTATIN CALCIUM 40 MG PO TABS: 40.0000 mg | ORAL_TABLET | Freq: Every day | ORAL | 11 refills | Status: DC

## 2022-05-11 ENCOUNTER — Other Ambulatory Visit: Payer: Self-pay | Admitting: *Deleted

## 2022-05-11 MED ORDER — METOPROLOL SUCCINATE ER 25 MG PO TB24: 12.5000 mg | ORAL_TABLET | Freq: Every day | ORAL | 3 refills | Status: DC

## 2022-06-20 ENCOUNTER — Other Ambulatory Visit: Payer: Self-pay | Admitting: Nurse Practitioner

## 2023-03-10 ENCOUNTER — Other Ambulatory Visit: Payer: Self-pay | Admitting: Nurse Practitioner

## 2023-03-10 NOTE — Telephone Encounter (Signed)
 Last office visit: 02/20/22 with plan to f/u 12 months. next office visit: 03/26/23

## 2023-03-26 ENCOUNTER — Ambulatory Visit: Payer: No Typology Code available for payment source | Attending: Cardiology | Admitting: Cardiology

## 2023-03-26 ENCOUNTER — Encounter: Payer: Self-pay | Admitting: Cardiology

## 2023-03-26 VITALS — BP 130/64 | HR 68 | Ht 68.0 in | Wt 207.8 lb

## 2023-03-26 DIAGNOSIS — Z951 Presence of aortocoronary bypass graft: Secondary | ICD-10-CM | POA: Diagnosis not present

## 2023-03-26 DIAGNOSIS — E7800 Pure hypercholesterolemia, unspecified: Secondary | ICD-10-CM

## 2023-03-26 DIAGNOSIS — I10 Essential (primary) hypertension: Secondary | ICD-10-CM

## 2023-03-26 MED ORDER — ASPIRIN 81 MG PO TBEC: 81.0000 mg | DELAYED_RELEASE_TABLET | Freq: Every day | ORAL | Status: AC

## 2023-03-26 MED ORDER — ROSUVASTATIN CALCIUM 40 MG PO TABS: 40.0000 mg | ORAL_TABLET | Freq: Every day | ORAL | 3 refills | Status: DC

## 2023-03-26 NOTE — Patient Instructions (Signed)
Medication Instructions:   Your physician recommends that you continue on your current medications as directed. Please refer to the Current Medication list given to you today.  *If you need a refill on your cardiac medications before your next appointment, please call your pharmacy*   Lab Work:  None Ordered  If you have labs (blood work) drawn today and your tests are completely normal, you will receive your results only by: MyChart Message (if you have MyChart) OR A paper copy in the mail If you have any lab test that is abnormal or we need to change your treatment, we will call you to review the results.   Testing/Procedures:  None Ordered   Follow-Up: At [facility], you and your health needs are our priority.  As part of our continuing mission to provide you with exceptional heart care, we have created designated Provider Care Teams.  These Care Teams include your primary Cardiologist (physician) and Advanced Practice Providers (APPs -  Physician Assistants and Nurse Practitioners) who all work together to provide you with the care you need, when you need it.  We recommend signing up for the patient portal called "MyChart".  Sign up information is provided on this After Visit Summary.  MyChart is used to connect with patients for Virtual Visits (Telemedicine).  Patients are able to view lab/test results, encounter notes, upcoming appointments, etc.  Non-urgent messages can be sent to your provider as well.   To learn more about what you can do with MyChart, go to [email or web address].    Your next appointment:   12 month(s)  Provider:   You may see Debbe Odea, MD or one of the following Advanced Practice Providers on your designated Care Team:   Nicolasa Ducking, NP Eula Listen, PA-C Cadence Fransico Michael, PA-C Charlsie Quest, NP Carlos Levering, NP

## 2023-03-26 NOTE — Progress Notes (Signed)
Cardiology Office Note:    Date:  03/26/2023   ID:  Nathan Hammond, DOB 04-Jan-1966, MRN [id number]  PCP:  Cleon Dew, FNP   [facility] Providers Cardiologist:  Debbe Odea, MD     Referring MD: Cleon Dew, FNP   Chief Complaint  Patient presents with   Follow-up    Patient denies new or acute cardiac problems/concerns today.      History of Present Illness:    Nathan Hammond is a 58 y.o. male with a hx of CAD/CABG x3 (LIMA- LAD, SVG-OM, SVG RCA in 05/2021), hypertension, hyperlipidemia who presents for follow-up.  Patient states doing okay, denies chest pain or shortness of breath.  Compliant with medications as prescribed.  Currently on aspirin 81 mg daily.  Tolerating all meds, no adverse effects.  Prior notes Echo 04/2021 EF 60 to 65% Left heart cath 04/2021 90% mid LAD, 100% RCA, 80% left circumflex    Past Medical History:  Diagnosis Date   Coronary artery disease    a. 04/2021 Cath: LM nl, LAD 30ost/p, 90p/m, 65m, LCX 60m/d, OM1/2/3 nl, RCA 121m, RPDA fills via L->R collats from dLAD, EF 55-65%; b. 05/2021 CABG x 3: LIMA->LAD, VG->OM, VG->RCA.   Essential hypertension    History of echocardiogram    a. 04/2021 Echo: EF 60-65%, no rwma, nl RV fxn.   Hyperlipidemia LDL goal <70     Past Surgical History:  Procedure Laterality Date   CORONARY ARTERY BYPASS GRAFT N/A 06/09/2021   Procedure: CORONARY ARTERY BYPASS GRAFTING X3 USING LEFT INTERNAL MAMMORY ARTERY AND RIGHT GREATER SAPHENOUS VEIN HARVESTED ENDOSCOPICALLY;  Surgeon: Lovett Sox, MD;  Location: [facility] OR;  Service: Open Heart Surgery;  Laterality: N/A;   LEFT HEART CATH AND CORONARY ANGIOGRAPHY N/A 05/07/2021   Procedure: LEFT HEART CATH AND CORONARY ANGIOGRAPHY;  Surgeon: Iran Ouch, MD;  Location: [facility] INVASIVE CV LAB;  Service: Cardiovascular;  Laterality: N/A;   TEE WITHOUT CARDIOVERSION N/A 06/09/2021   Procedure: TRANSESOPHAGEAL ECHOCARDIOGRAM (TEE);  Surgeon: Lovett Sox,  MD;  Location: [facility] OR;  Service: Open Heart Surgery;  Laterality: N/A;    Current Medications: Current Meds  Medication Sig   Ascorbic Acid (VITAMIN C) 1000 MG tablet Take 1,000 mg by mouth daily.   Coenzyme Q10 (COQ-10) 100 MG CAPS Take 100 mg by mouth daily.   ezetimibe (ZETIA) 10 MG tablet Take 1 tablet by mouth once daily   Menaquinone-7 (K2 PO) Take 1 tablet by mouth daily.   metoprolol succinate (TOPROL XL) 25 MG 24 hr tablet Take 0.5 tablets (12.5 mg total) by mouth daily.   Multiple Vitamin (MULTIVITAMIN WITH MINERALS) TABS tablet Take 1 tablet by mouth daily.   OMEGA-3 FATTY ACIDS PO Take 1 capsule by mouth in the morning.   [DISCONTINUED] aspirin EC 325 MG EC tablet Take 1 tablet (325 mg total) by mouth daily.   [DISCONTINUED] rosuvastatin (CRESTOR) 40 MG tablet Take 1 tablet (40 mg total) by mouth daily.     Allergies:   Morphine   Social History   Socioeconomic History   Marital status: Single    Spouse name: Not on file   Number of children: Not on file   Years of education: Not on file   Highest education level: Not on file  Occupational History   Not on file  Tobacco Use   Smoking status: Never   Smokeless tobacco: Never  Vaping Use   Vaping status: Never Used  Substance and Sexual Activity   Alcohol use: Yes  Comment: rare   Drug use: Never   Sexual activity: Not on file  Other Topics Concern   Not on file  Social History Narrative   Not on file   Social Drivers of Health   Financial Resource Strain: High Risk (01/26/2023)   Received from [facility]   Overall Financial Resource Strain (CARDIA)    Difficulty of Paying Living Expenses: Hard  Food Insecurity: No Food Insecurity (01/26/2023)   Received from [facility]   Hunger Vital Sign    Worried About Running Out of Food in the Last Year: Never true    Ran Out of Food in the Last Year: Never true  Transportation Needs: No Transportation Needs (01/26/2023)   Received from [facility] - Transportation    Lack of Transportation (Medical): No    Lack of Transportation (Non-Medical): No  Physical Activity: Not on file  Stress: Not on file  Social Connections: Not on file     Family History: The patient's family history includes Heart attack in his father.  ROS:   Please see the history of present illness.     All other systems reviewed and are negative.  EKGs/Labs/Other Studies Reviewed:    The following studies were reviewed today:   EKG Interpretation Date/Time:  Friday March 26 2023 09:49:57 EST Ventricular Rate:  68 PR Interval:  172 QRS Duration:  72 QT Interval:  370 QTC Calculation: 393 R Axis:   26  Text Interpretation: Normal sinus rhythm Septal infarct , age undetermined Confirmed by Debbe Odea ([id number]) on 03/26/2023 10:30:36 AM    Recent Labs: No results found for requested labs within last 365 days.  Recent Lipid Panel    Component Value Date/Time   CHOL 108 07/10/2021 0812   TRIG 57 07/10/2021 0812   HDL 50 07/10/2021 0812   CHOLHDL 2.2 07/10/2021 0812   VLDL 11 07/10/2021 0812   LDLCALC 47 07/10/2021 0812     Risk Assessment/Calculations:          Physical Exam:    VS:  BP 130/64 (BP Location: Left Arm, Patient Position: Sitting, Cuff Size: Large)   Pulse 68   Ht 5\' 8"  (1.727 m)   Wt 207 lb 12.8 oz (94.3 kg)   SpO2 98%   BMI 31.60 kg/m     Wt Readings from Last 3 Encounters:  03/26/23 207 lb 12.8 oz (94.3 kg)  02/20/22 204 lb 12.8 oz (92.9 kg)  09/22/21 199 lb 9.6 oz (90.5 kg)     GEN:  Well nourished, well developed in no acute distress HEENT: Normal NECK: No JVD; No carotid bruits CARDIAC: RRR, no murmurs, rubs, gallops RESPIRATORY:  Clear to auscultation without rales, wheezing or rhonchi  ABDOMEN: Soft, non-tender, non-distended MUSCULOSKELETAL:  No edema; surgical/sternal scar appears well-healed SKIN: Warm and dry NEUROLOGIC:  Alert and oriented x 3 PSYCHIATRIC:  Normal affect    ASSESSMENT:    1. S/P CABG x 3   2. Primary hypertension   3. Pure hypercholesterolemia    PLAN:    In order of problems listed above:  S/p CABG x3, v.  Denies chest pain, continue Toprol-XL 12.5 mg daily, aspirin 81 mg daily, Crestor 40 mg daily, Zetia. Hypertension, BP controlled.  Continue Toprol-XL 12.5 mg daily. Hyperlipidemia, LDL at goal, continue Crestor 40 mg daily, Zetia.  Follow-up in 12 months.      Medication Adjustments/Labs and Tests Ordered: Current medicines are reviewed at length with the  patient today.  Concerns regarding medicines are outlined above.  Orders Placed This Encounter  Procedures   EKG 12-Lead   Meds ordered this encounter  Medications   rosuvastatin (CRESTOR) 40 MG tablet    Sig: Take 1 tablet (40 mg total) by mouth daily.    Dispense:  90 tablet    Refill:  3   aspirin EC 81 MG tablet    Sig: Take 1 tablet (81 mg total) by mouth daily.    Patient Instructions  Medication Instructions:   Your physician recommends that you continue on your current medications as directed. Please refer to the Current Medication list given to you today.  *If you need a refill on your cardiac medications before your next appointment, please call your pharmacy*   Lab Work:  None Ordered  If you have labs (blood work) drawn today and your tests are completely normal, you will receive your results only by: MyChart Message (if you have MyChart) OR A paper copy in the mail If you have any lab test that is abnormal or we need to change your treatment, we will call you to review the results.   Testing/Procedures:  None Ordered    Follow-Up: At [facility], you and your health needs are our priority.  As part of our continuing mission to provide you with exceptional heart care, we have created designated Provider Care Teams.  These Care Teams include your primary Cardiologist (physician) and Advanced Practice Providers (APPs -  Physician  Assistants and Nurse Practitioners) who all work together to provide you with the care you need, when you need it.  We recommend signing up for the patient portal called "MyChart".  Sign up information is provided on this After Visit Summary.  MyChart is used to connect with patients for Virtual Visits (Telemedicine).  Patients are able to view lab/test results, encounter notes, upcoming appointments, etc.  Non-urgent messages can be sent to your provider as well.   To learn more about what you can do with MyChart, go to [email or web address].    Your next appointment:   12 month(s)  Provider:   You may see Debbe Odea, MD or one of the following Advanced Practice Providers on your designated Care Team:   Nicolasa Ducking, NP Eula Listen, PA-C Cadence Fransico Michael, PA-C Charlsie Quest, NP Carlos Levering, NP   Signed, Debbe Odea, MD  03/26/2023 10:57 AM    [facility]

## 2023-04-08 ENCOUNTER — Other Ambulatory Visit: Payer: Self-pay | Admitting: *Deleted

## 2023-04-08 MED ORDER — EZETIMIBE 10 MG PO TABS: 10.0000 mg | ORAL_TABLET | Freq: Every day | ORAL | 5 refills | Status: DC

## 2023-04-20 IMAGING — DX DG CHEST 2V
2 series · 2 of 2 positions shown · non-contrast
Comparison: 04/22/2021

CLINICAL DATA: 56-year-old male with preoperative chest x-ray

EXAM:
CHEST - 2 VIEW

[w chest pa]
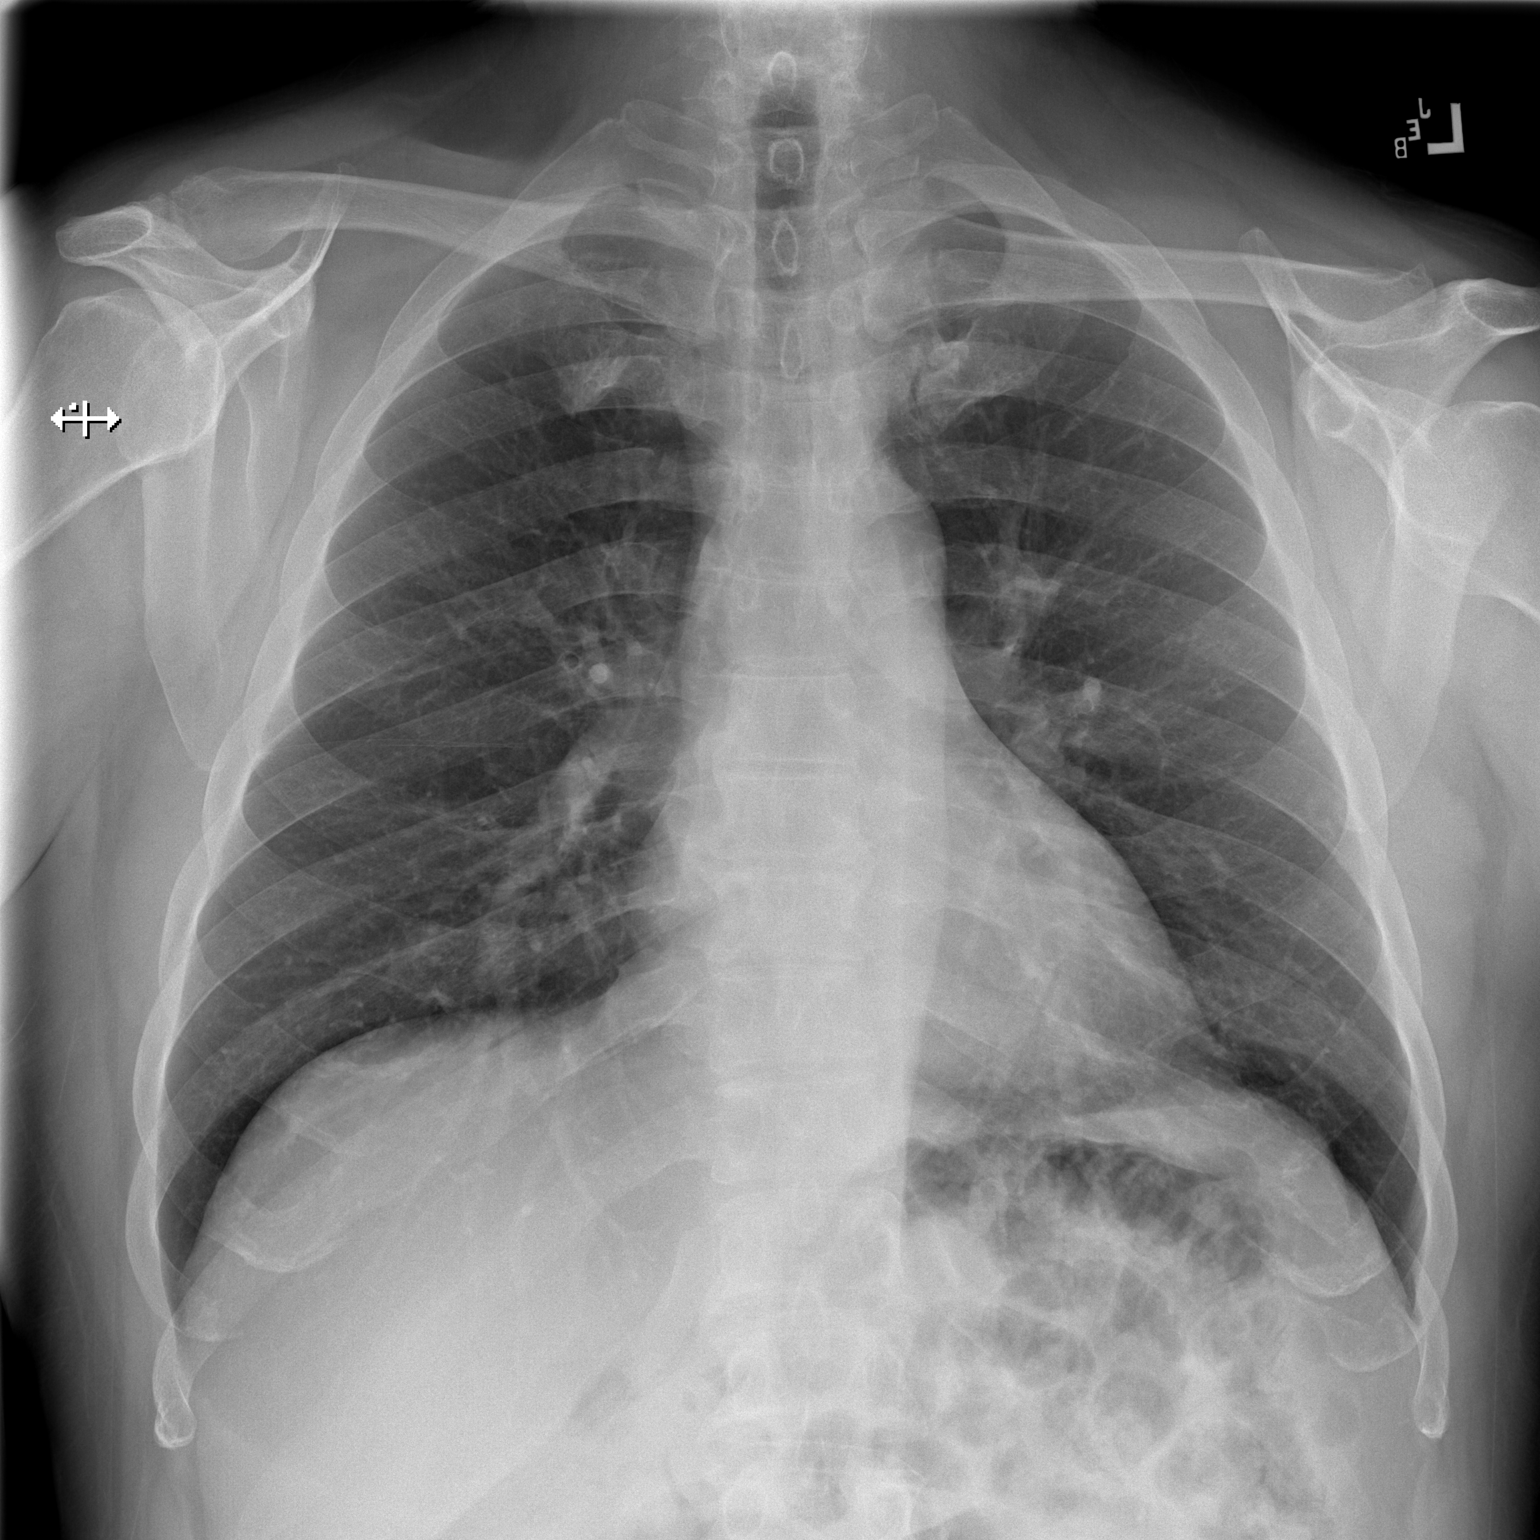

[w chest lat]
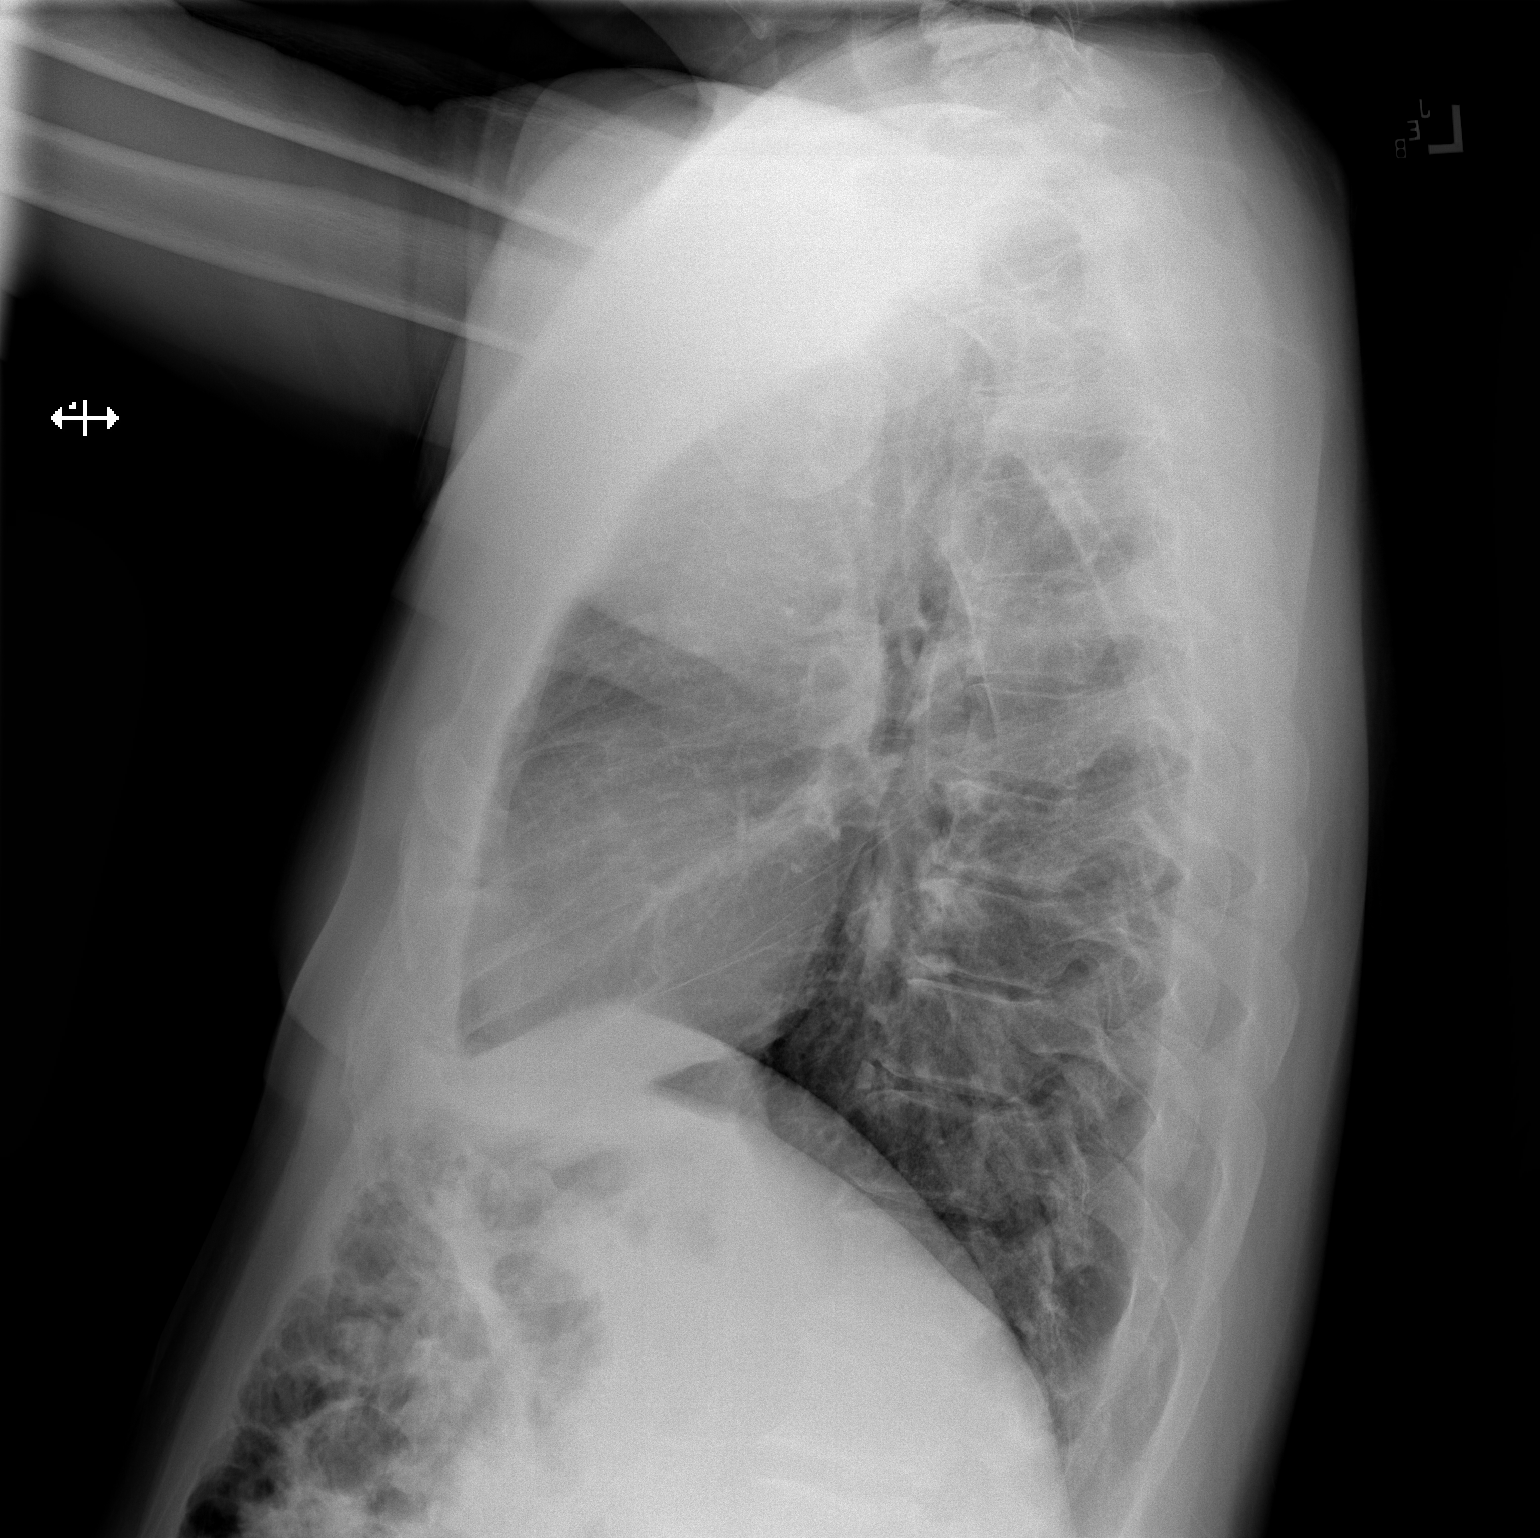

[2 of 2 positions shown; findings below may reference images not displayed]

FINDINGS: Cardiomediastinal silhouette unchanged in size and contour. No
evidence of central vascular congestion. No interlobular septal
thickening.

No pneumothorax or pleural effusion. Coarsened interstitial
markings, with no confluent airspace disease.

No acute displaced fracture. Degenerative changes of the spine.
IMPRESSION: Negative for acute cardiopulmonary disease

## 2023-04-27 ENCOUNTER — Other Ambulatory Visit: Payer: Self-pay

## 2023-04-27 MED ORDER — METOPROLOL SUCCINATE ER 25 MG PO TB24: 12.5000 mg | ORAL_TABLET | Freq: Every day | ORAL | 3 refills | Status: AC

## 2023-05-19 IMAGING — CR DG CHEST 2V
2 series · 2 of 2 positions shown · non-contrast
Comparison: Prior chest radiographs 06/14/2021 and earlier.

CLINICAL DATA: Provided history: Coronary artery disease involving
native heart without angina pectoris, unspecified vessel or lesion.

EXAM:
CHEST - 2 VIEW

[w chest pa]
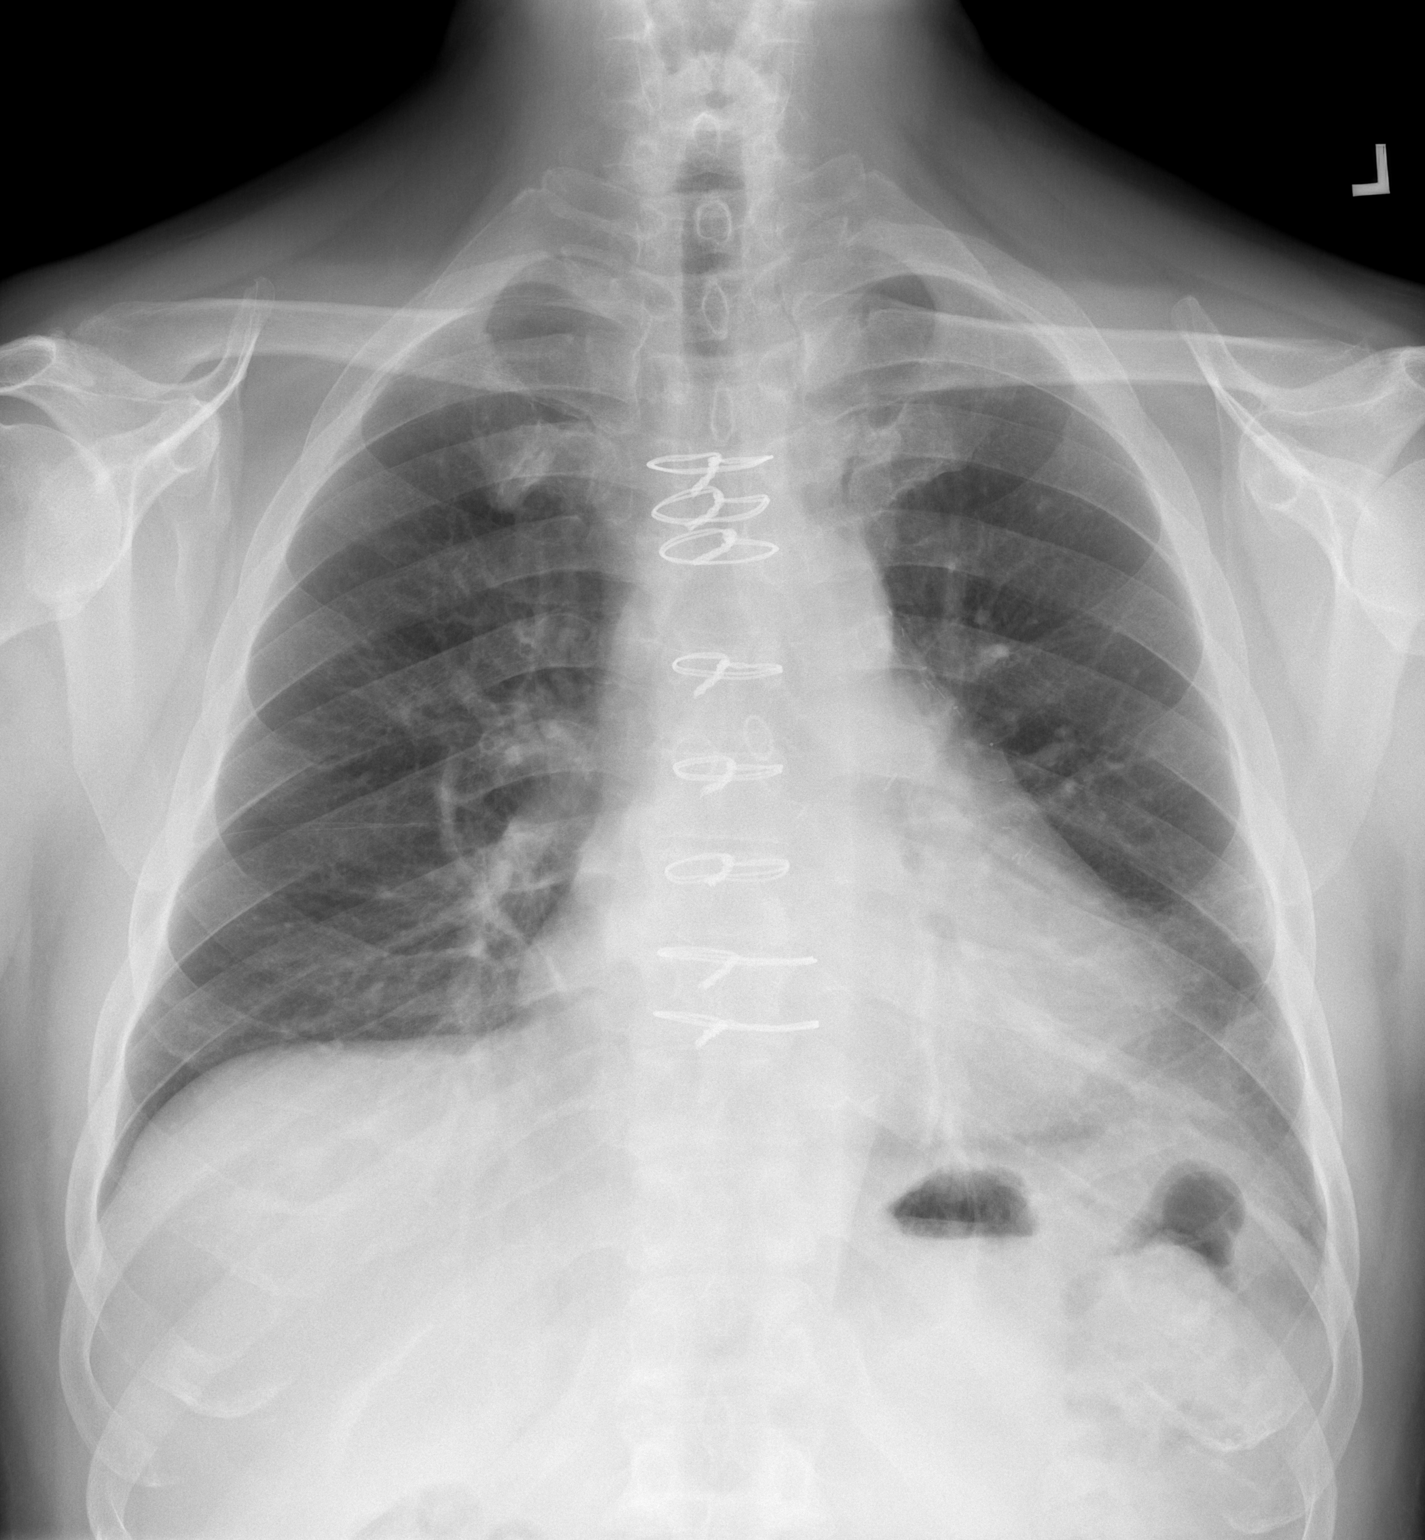

[w chest lat]
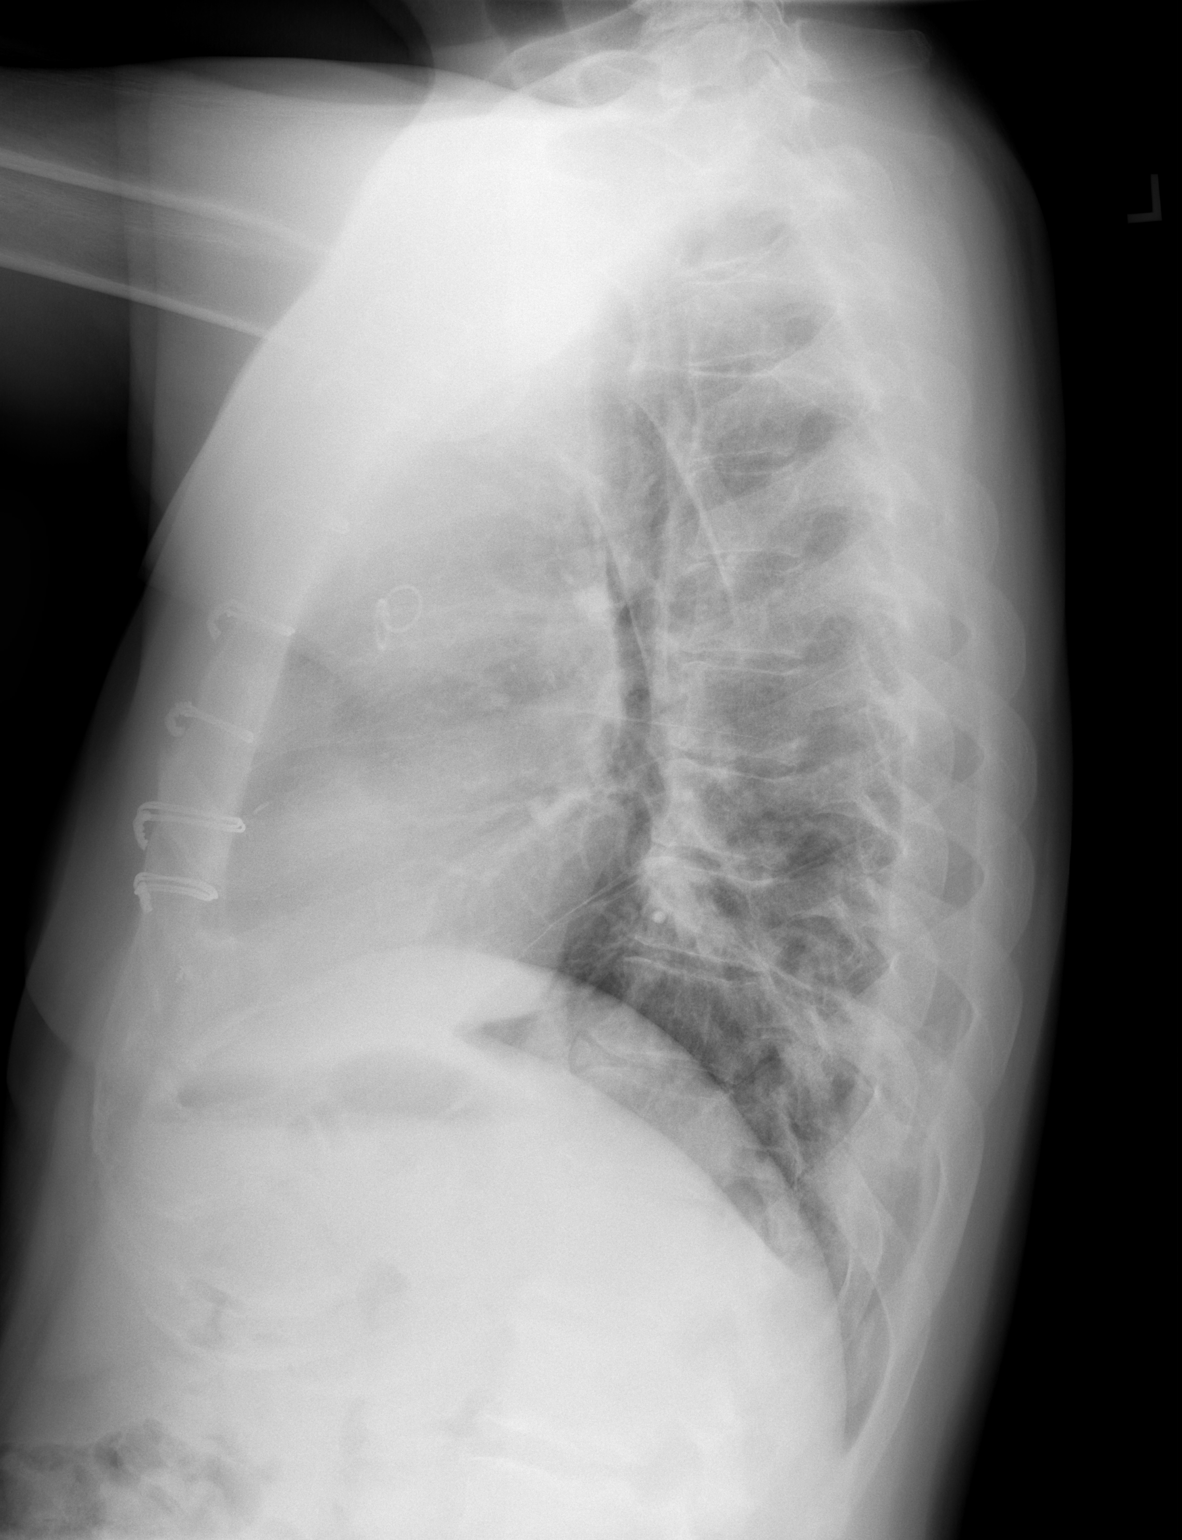

[2 of 2 positions shown; findings below may reference images not displayed]

FINDINGS: Prior median sternotomy/CABG. Borderline cardiomegaly, unchanged.
Persistent ill-defined opacity within the left lung base. No
appreciable airspace consolidation within the right lung. No
evidence of pleural effusion or pneumothorax. No acute bony
abnormality identified.
IMPRESSION: Persistent ill-defined opacity within the left lung base. This may
reflect atelectasis. However, correlate clinically to exclude
signs/symptoms of pneumonia.

Borderline cardiomegaly status post CABG, unchanged.

## 2023-09-19 ENCOUNTER — Other Ambulatory Visit: Payer: Self-pay | Admitting: Cardiology

## 2024-03-06 ENCOUNTER — Other Ambulatory Visit: Payer: Self-pay | Admitting: Cardiology

## 2024-03-16 ENCOUNTER — Other Ambulatory Visit: Payer: Self-pay | Admitting: Cardiology

## 2024-03-16 NOTE — Telephone Encounter (Signed)
 Please contact pt for future appointment. Pt due for follow up.

## 2024-03-16 NOTE — Telephone Encounter (Signed)
 Does not wish to schedule.

## 2024-03-17 NOTE — Telephone Encounter (Signed)
 In accordance with refill protocols, please review and address the following requirements before this medication refill can be authorized:  Labs

## 2024-04-05 ENCOUNTER — Other Ambulatory Visit: Payer: Self-pay | Admitting: Cardiology
